# Patient Record
Sex: Male | Born: 1960
Health system: Southern US, Community
[De-identification: ages and names within clinical notes are randomized; demographics above are authoritative.]

## PROBLEM LIST (undated history)

## (undated) DIAGNOSIS — R739 Hyperglycemia, unspecified: Secondary | ICD-10-CM

## (undated) DIAGNOSIS — G4700 Insomnia, unspecified: Secondary | ICD-10-CM

## (undated) DIAGNOSIS — E785 Hyperlipidemia, unspecified: Secondary | ICD-10-CM

## (undated) HISTORY — DX: Hyperlipidemia, unspecified: E78.5

## (undated) HISTORY — DX: Hyperglycemia, unspecified: R73.9

## (undated) HISTORY — DX: Insomnia, unspecified: G47.00

---

## 1991-07-19 HISTORY — PX: HERNIA REPAIR: SHX51

## 2011-02-17 ENCOUNTER — Other Ambulatory Visit: Payer: Self-pay | Admitting: Internal Medicine

## 2011-02-17 ENCOUNTER — Inpatient Hospital Stay: Payer: Self-pay | Admitting: Student

## 2012-07-18 IMAGING — CT CT HEAD WITHOUT CONTRAST
2 series · 16 of 30 positions shown, 20 images · non-contrast
Comparison: none

REASON FOR EXAM: ams
COMMENTS:

PROCEDURE:     CT  - CT HEAD WITHOUT CONTRAST  - February 17, 2011  [DATE]
RESULT:     Technique: Helical 5mm sections were obtained from the skull
base to the vertex without administration of intravenous contrast.

[Series 2: without · axial · non-contrast · 0.45mm/px · z∈[-100,+35]mm · 13 of 33 slices shown, 17 images]
[im 3/33  brain]
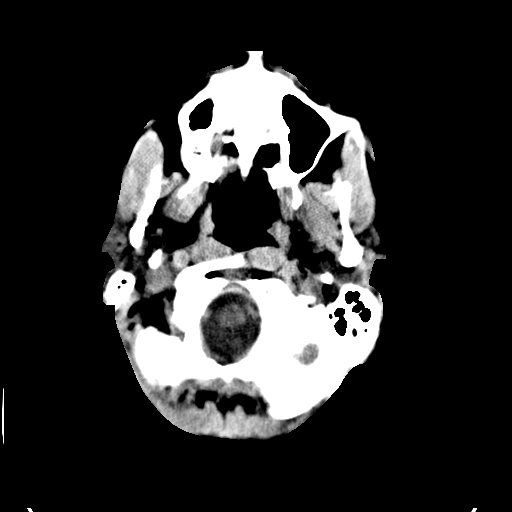
[im 3/33  bone]
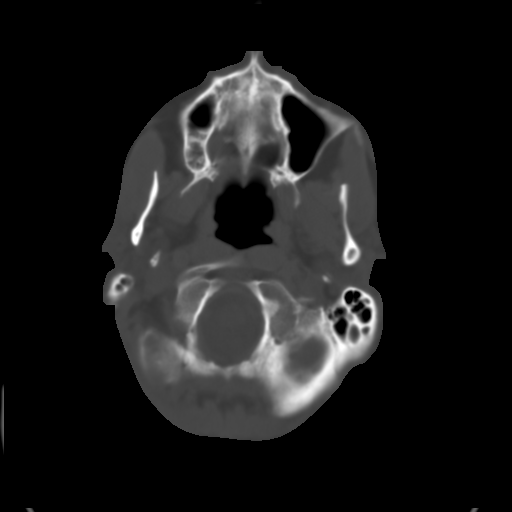
[im 5/33  brain]
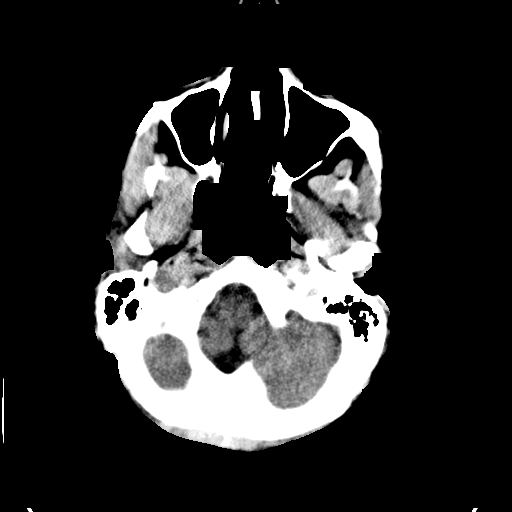
[im 7/33  brain]
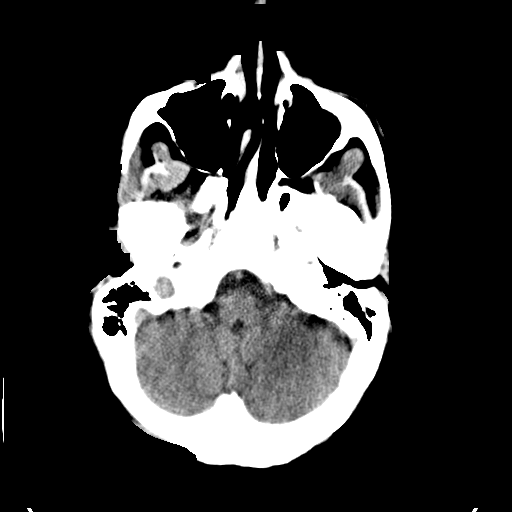
[im 10/33  brain]
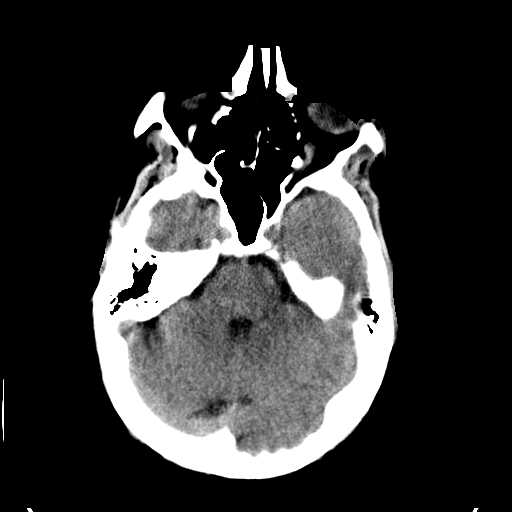
[im 12/33  brain]
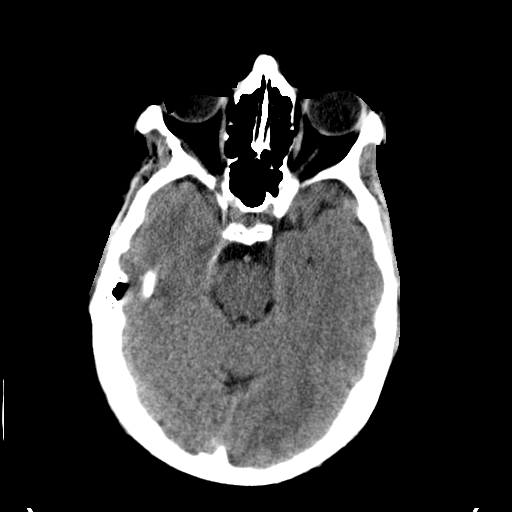
[im 12/33  bone]
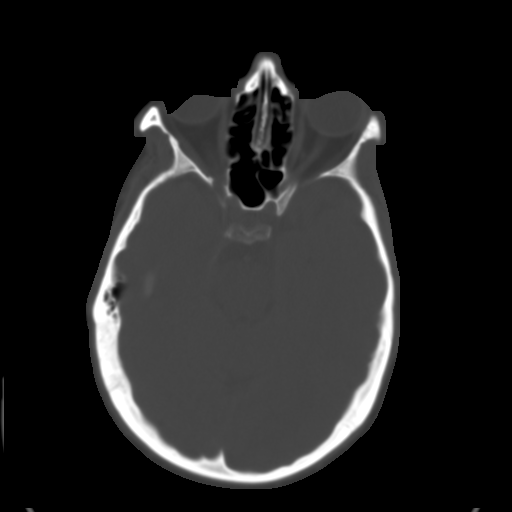
[im 14/33  brain]
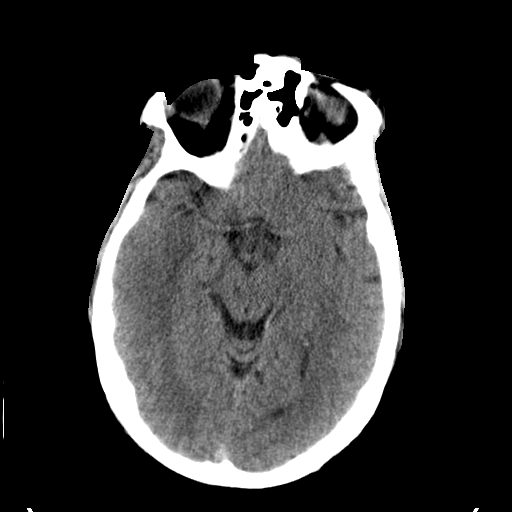
[im 17/33  brain]
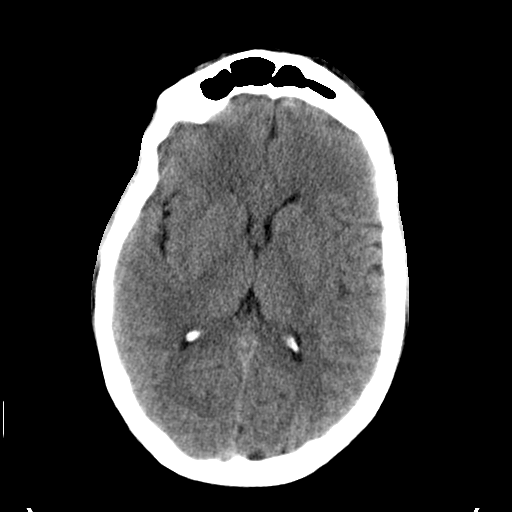
[im 19/33  brain]
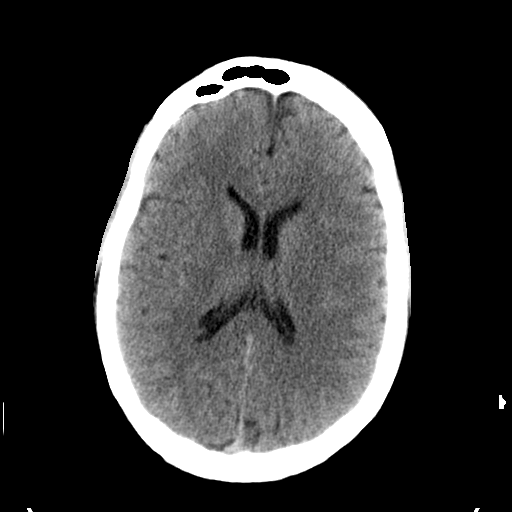
[im 21/33  brain]
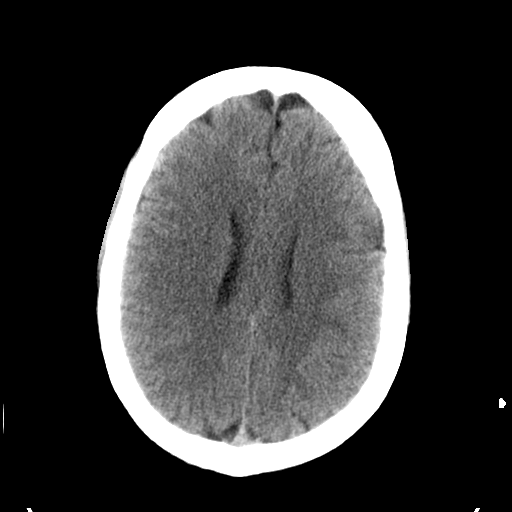
[im 21/33  bone]
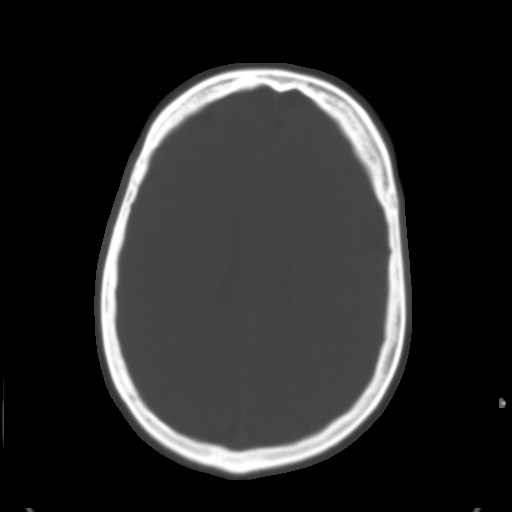
[im 23/33  brain]
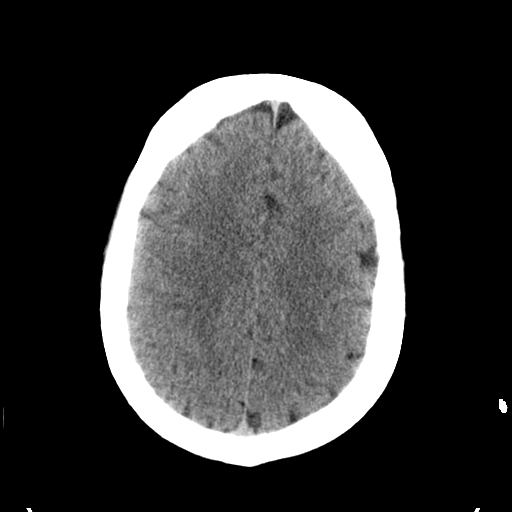
[im 26/33  brain]
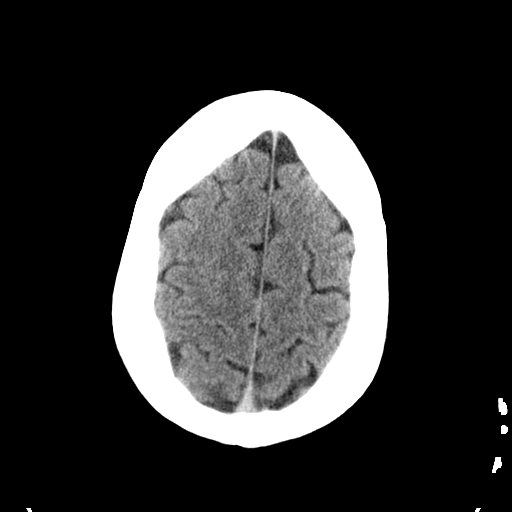
[im 28/33  brain]
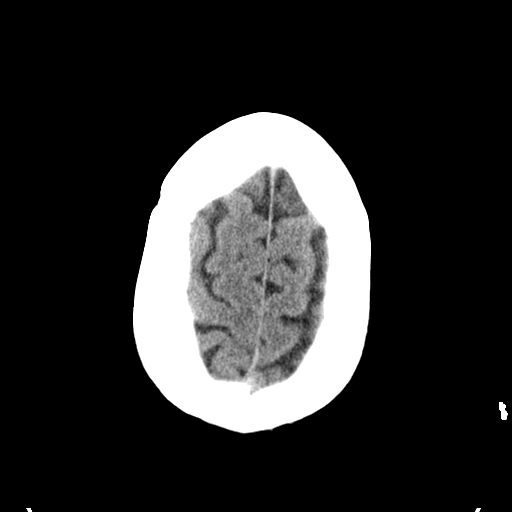
[im 30/33  brain]
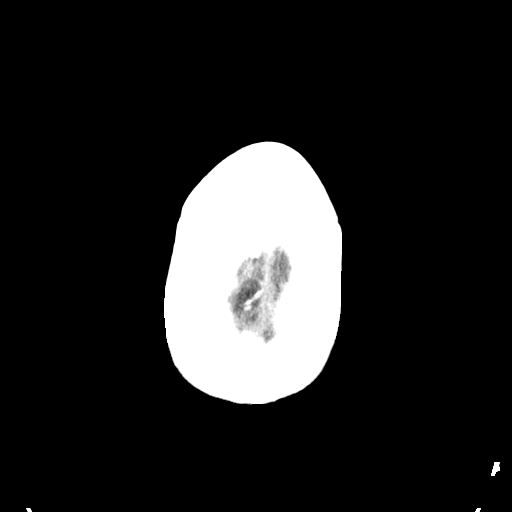
[im 30/33  bone]
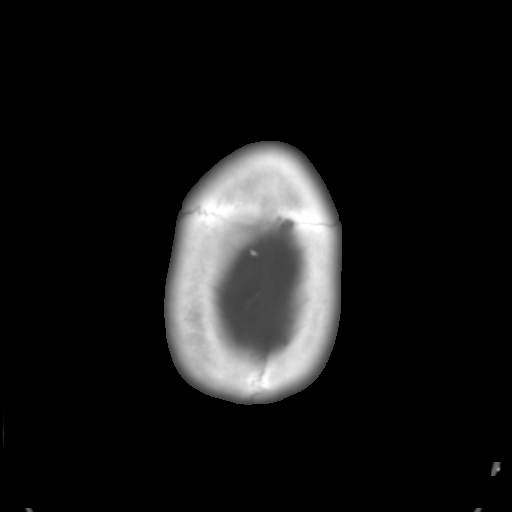

[Series 3: bone · axial · 0.45mm/px · z∈[-100,-55]mm · 3 of 33 slices shown]
[im 3/33  bone]
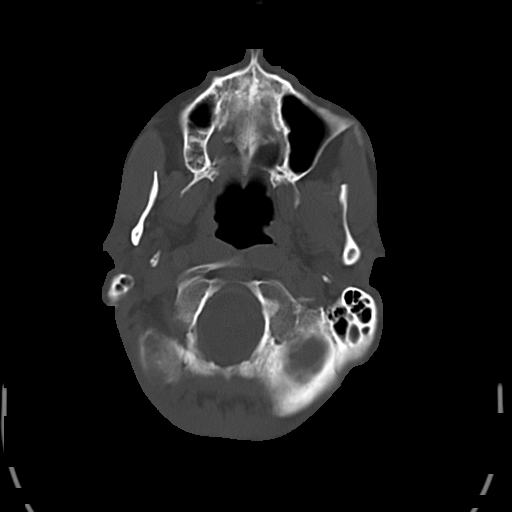
[im 7/33  bone]
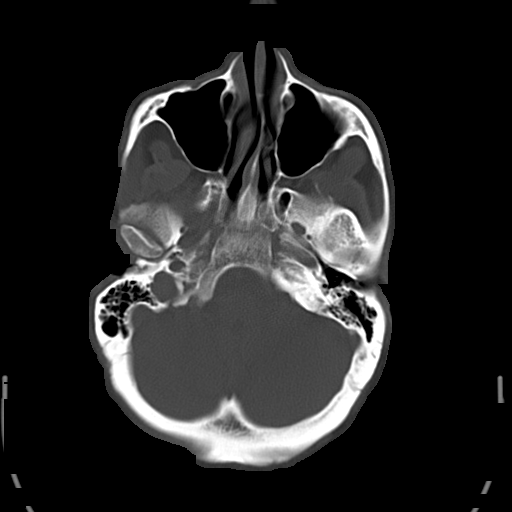
[im 12/33  bone]
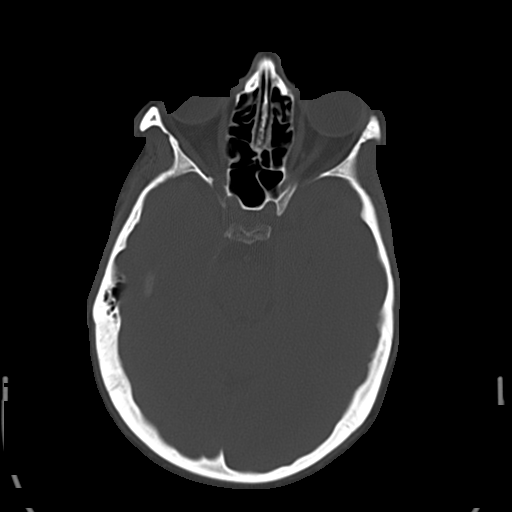

[16 of 30 positions shown; findings below may reference images not displayed]

FINDINGS: There is not evidence of intra-axial fluid collections. There is
no evidence of acute hemorrhage or secondary signs reflecting mass effect or
subacute or chronic focal territorial infarction. The osseous structures
demonstrate no evidence of a depressed skull fracture. If there is
persistent concern clinical follow-up with MRI is recommended.

There are findings which may represent a periorbital hematoma on the left.
IMPRESSION: 1. No evidence of acute intracranial abnormalitites.

## 2014-06-10 LAB — LIPID PANEL
Cholesterol: 219 mg/dL — AB (ref 0–200)
HDL: 75 mg/dL — AB (ref 35–70)
LDL CALC: 129 mg/dL
TRIGLYCERIDES: 75 mg/dL (ref 40–160)

## 2014-06-10 LAB — HEMOGLOBIN A1C: Hemoglobin A1C: 5.6

## 2014-06-10 LAB — HEPATIC FUNCTION PANEL: ALT: 22 U/L (ref 10–40)

## 2015-06-26 ENCOUNTER — Other Ambulatory Visit: Payer: Self-pay | Admitting: Family Medicine

## 2015-06-29 NOTE — Telephone Encounter (Signed)
Patient is due for follow up.  May call in rx to get by until he can be seen in office.

## 2015-06-29 NOTE — Telephone Encounter (Signed)
Rx called into pharmacy. Notified patient that he is due for f/u ov.

## 2015-07-27 ENCOUNTER — Other Ambulatory Visit: Payer: Self-pay | Admitting: Family Medicine

## 2015-10-15 DIAGNOSIS — Z8249 Family history of ischemic heart disease and other diseases of the circulatory system: Secondary | ICD-10-CM | POA: Insufficient documentation

## 2015-10-15 DIAGNOSIS — R55 Syncope and collapse: Secondary | ICD-10-CM | POA: Insufficient documentation

## 2015-10-15 DIAGNOSIS — R739 Hyperglycemia, unspecified: Secondary | ICD-10-CM | POA: Insufficient documentation

## 2015-10-15 DIAGNOSIS — G4700 Insomnia, unspecified: Secondary | ICD-10-CM | POA: Insufficient documentation

## 2015-10-15 DIAGNOSIS — E785 Hyperlipidemia, unspecified: Secondary | ICD-10-CM | POA: Insufficient documentation

## 2015-10-16 ENCOUNTER — Encounter: Payer: Self-pay | Admitting: Family Medicine

## 2015-10-16 ENCOUNTER — Ambulatory Visit (INDEPENDENT_AMBULATORY_CARE_PROVIDER_SITE_OTHER): Payer: Self-pay | Admitting: Family Medicine

## 2015-10-16 VITALS — BP 102/74 | HR 76 | Temp 98.2°F | Resp 16 | Ht >= 80 in | Wt 157.0 lb

## 2015-10-16 DIAGNOSIS — Z0000 Encounter for general adult medical examination without abnormal findings: Secondary | ICD-10-CM

## 2015-10-16 DIAGNOSIS — G4700 Insomnia, unspecified: Secondary | ICD-10-CM

## 2015-10-16 DIAGNOSIS — R739 Hyperglycemia, unspecified: Secondary | ICD-10-CM

## 2015-10-16 DIAGNOSIS — E785 Hyperlipidemia, unspecified: Secondary | ICD-10-CM

## 2015-10-16 DIAGNOSIS — K429 Umbilical hernia without obstruction or gangrene: Secondary | ICD-10-CM

## 2015-10-16 MED ORDER — ZOLPIDEM TARTRATE 5 MG PO TABS: 5.0000 mg | ORAL_TABLET | Freq: Every evening | ORAL | Status: DC | PRN

## 2015-10-16 NOTE — Patient Instructions (Signed)
You are due for a colonoscopy to screen for colon polyps and tumors. Please call the office at [phone] at your earliest convenience for a referral.

## 2015-10-16 NOTE — Progress Notes (Signed)
Patient: Bryan Beard, Male    DOB: Jul 30, 1960, 55 y.o.   MRN: [id number] Visit Date: 10/16/2015  Today's Provider: Mila Merry, MD   Chief Complaint  Patient presents with  . Annual Exam  . Hyperlipidemia  . Insomnia  . Hyperglycemia   Subjective:    Annual physical exam Bryan Beard is a 55 y.o. male who presents today for health maintenance and complete physical. He feels well. He reports exercising some/bike. He reports he is sleeping poorly/ has trouble falling asleep.  -----------------------------------------------------------------  Follow-up for insomnia from 06/10/2014; no changes. Takes Ambien about 3 times a week and tolerating well.      Lipid/Cholesterol, Follow-up:   Last seen for this 06/10/2014.  Management since that visit includes; restarted Lovastatin 20 mg qd..  Last Lipid Panel:    Component Value Date/Time   CHOL 219* 06/10/2014   TRIG 75 06/10/2014   HDL 75* 06/10/2014   LDLCALC 129 06/10/2014    He reports good compliance with treatment. He is not having side effects. none  Wt Readings from Last 3 Encounters:  10/16/15 157 lb (71.215 kg)  06/10/14 156 lb (70.761 kg)    ----------------------------------------------------------------------     Review of Systems  Constitutional: Negative.   HENT: Negative.   Eyes: Negative.   Respiratory: Negative.   Cardiovascular: Negative.   Gastrointestinal: Negative.   Endocrine: Negative.   Genitourinary: Negative.   Musculoskeletal: Negative.   Skin: Negative.   Allergic/Immunologic: Negative.   Neurological: Negative.   Hematological: Negative.   Psychiatric/Behavioral: Positive for sleep disturbance.    Social History      He  reports that he has never smoked. He does not have any smokeless tobacco history on file. He reports that he drinks alcohol. He reports that he does not use illicit drugs.       Social History   Social History  . Marital Status:  Single    Spouse Name: N/A  . Number of Children: 0  . Years of Education: N/A   Occupational History  .      Works in [employer]   Social History Main Topics  . Smoking status: Never Smoker   . Smokeless tobacco: None  . Alcohol Use: 0.0 oz/week    0 Standard drinks or equivalent per week     Comment: 3-4 beers about 3 days a week  . Drug Use: No     Comment: has used Marijuana in the past 30 years  . Sexual Activity: Not Asked   Other Topics Concern  . None   Social History Narrative    Past Medical History  Diagnosis Date  . Hyperlipidemia   . Hyperglycemia   . Insomnia      Patient Active Problem List   Diagnosis Date Noted  . Dyslipidemia 10/15/2015  . Family history of early CAD 10/15/2015  . Hyperglycemia 10/15/2015  . Insomnia 10/15/2015  . Episode of syncope 10/15/2015    Past Surgical History  Procedure Laterality Date  . Hernia repair  1993    Family History        Family Status  Relation Status Death Age  . Mother Alive   . Father Deceased 65    cause of death: MI  . Brother Alive         His family history includes CAD in his other; Diabetes in his father; Heart attack in his father.    No Known Allergies  Previous Medications  ASCORBIC ACID (VITAMIN C) 500 MG CAPS    Take 1 capsule by mouth daily.   ASPIRIN 81 MG TABLET    Take 1 tablet by mouth daily.   LOVASTATIN (MEVACOR) 20 MG TABLET    TAKE 1 TABLET BY MOUTH EVERY DAY   MULTIPLE VITAMINS-MINERALS (MULTIVITAMIN ADULT PO)    Take 1 tablet by mouth daily.   ZOLPIDEM (AMBIEN) 5 MG TABLET    TAKE 1 TABLET BY MOUTH AT BEDTIME AS NEEDED FOR INSOMNIA    Patient Care Team: Malva Limes, MD as PCP - General (Family Medicine)     Objective:   Vitals: BP 102/74 mmHg  Pulse 76  Temp(Src) 98.2 F (36.8 C) (Oral)  Resp 16  Ht 6' 11.5" (2.121 m)  Wt 157 lb (71.215 kg)  BMI 15.83 kg/m2  SpO2 98%   Physical Exam   General Appearance:    Alert, cooperative, no distress,  appears stated age  Head:    Normocephalic, without obvious abnormality, atraumatic  Eyes:    PERRL, conjunctiva/corneas clear, EOM's intact, fundi    benign, both eyes       Ears:    Normal TM's and external ear canals, both ears  Nose:   Nares normal, septum midline, mucosa normal, no drainage   or sinus tenderness  Throat:   Lips, mucosa, and tongue normal; teeth and gums normal  Neck:   Supple, symmetrical, trachea midline, no adenopathy;       thyroid:  No enlargement/tenderness/nodules; no carotid   bruit or JVD  Back:     Symmetric, no curvature, ROM normal, no CVA tenderness  Lungs:     Clear to auscultation bilaterally, respirations unlabored  Chest wall:    No tenderness or deformity  Heart:    Regular rate and rhythm, S1 and S2 normal, no murmur, rub   or gallop  Abdomen:     Soft, non-tender, bowel sounds active all four quadrants,    no masses, no organomegaly. Small non-tender umbilical hernia  Genitalia:    deferred  Rectal:    deferred  Extremities:   Extremities normal, atraumatic, no cyanosis or edema  Pulses:   2+ and symmetric all extremities  Skin:   Skin color, texture, turgor normal, no rashes or lesions  Lymph nodes:   Cervical, supraclavicular, and axillary nodes normal  Neurologic:   CNII-XII intact. Normal strength, sensation and reflexes      throughout    Depression Screen PHQ 2/9 Scores 10/16/2015  PHQ - 2 Score 0  PHQ- 9 Score 2      Assessment & Plan:     Routine Health Maintenance and Physical Exam  Exercise Activities and Dietary recommendations Goals    None      Immunization History  Administered Date(s) Administered  . Influenza-Unspecified 05/19/2015  . Tdap 11/25/2009    Health Maintenance  Topic Date Due  . HIV Screening  01/26/1976  . COLONOSCOPY  01/26/2011  . INFLUENZA VACCINE  02/16/2015  . TETANUS/TDAP  11/26/2019  . Hepatitis C Screening  Addressed      Discussed health benefits of physical activity, and  encouraged him to engage in regular exercise appropriate for his age and condition.    --------------------------------------------------------------------  1. Annual physical exam  - Comprehensive metabolic panel - PSA  2. Umbilical hernia without obstruction and without gangrene Small causing no symptoms  3. Hyperglycemia  - Hemoglobin A1c  4. Dyslipidemia He is tolerating lovastatin well with no adverse effects.   -  Lipid panel  5. Insomnia Refill ambien - zolpidem (AMBIEN) 5 MG tablet; Take 1 tablet (5 mg total) by mouth at bedtime as needed for sleep.  Dispense: 30 tablet; Refill: 3

## 2015-10-17 LAB — COMPREHENSIVE METABOLIC PANEL
A/G RATIO: 2.5 — AB (ref 1.2–2.2)
ALK PHOS: 72 IU/L (ref 39–117)
ALT: 30 IU/L (ref 0–44)
AST: 25 IU/L (ref 0–40)
Albumin: 5.2 g/dL (ref 3.5–5.5)
BUN/Creatinine Ratio: 9 (ref 9–20)
BUN: 8 mg/dL (ref 6–24)
Bilirubin Total: 0.5 mg/dL (ref 0.0–1.2)
CHLORIDE: 99 mmol/L (ref 96–106)
CO2: 23 mmol/L (ref 18–29)
Calcium: 10.5 mg/dL — ABNORMAL HIGH (ref 8.7–10.2)
Creatinine, Ser: 0.87 mg/dL (ref 0.76–1.27)
GFR calc Af Amer: 113 mL/min/{1.73_m2} (ref >59)
GFR, EST NON AFRICAN AMERICAN: 98 mL/min/{1.73_m2} (ref >59)
Globulin, Total: 2.1 g/dL (ref 1.5–4.5)
Glucose: 108 mg/dL — ABNORMAL HIGH (ref 65–99)
POTASSIUM: 4.1 mmol/L (ref 3.5–5.2)
SODIUM: 142 mmol/L (ref 134–144)
Total Protein: 7.3 g/dL (ref 6.0–8.5)

## 2015-10-17 LAB — PSA: PROSTATE SPECIFIC AG, SERUM: 0.4 ng/mL (ref 0.0–4.0)

## 2015-10-17 LAB — LIPID PANEL
Chol/HDL Ratio: 2.5 ratio units (ref 0.0–5.0)
Cholesterol, Total: 190 mg/dL (ref 100–199)
HDL: 76 mg/dL (ref >39)
LDL Calculated: 93 mg/dL (ref 0–99)
TRIGLYCERIDES: 107 mg/dL (ref 0–149)
VLDL Cholesterol Cal: 21 mg/dL (ref 5–40)

## 2015-10-17 LAB — HEMOGLOBIN A1C
ESTIMATED AVERAGE GLUCOSE: 117 mg/dL
HEMOGLOBIN A1C: 5.7 % — AB (ref 4.8–5.6)

## 2015-10-21 ENCOUNTER — Telehealth: Payer: Self-pay

## 2015-10-21 NOTE — Telephone Encounter (Signed)
Patient advised as directed below. Patient verbalized understanding.  

## 2015-10-21 NOTE — Telephone Encounter (Signed)
-----   Message from Malva Limesonald E Fisher, MD sent at 10/19/2015  1:36 PM EDT ----- PSA, kidney functions, electrolytes and cholesterol are all normal. Blood sugar slightly elevated. Avoid sweets and starchy food. Check labs yearly.

## 2015-10-21 NOTE — Telephone Encounter (Signed)
Attempted to contact patient. No answer or voicemail.  

## 2015-10-27 ENCOUNTER — Encounter: Payer: Self-pay | Admitting: Family Medicine

## 2015-12-17 ENCOUNTER — Other Ambulatory Visit: Payer: Self-pay | Admitting: Family Medicine

## 2016-04-28 ENCOUNTER — Other Ambulatory Visit: Payer: Self-pay | Admitting: Family Medicine

## 2016-04-28 DIAGNOSIS — G4700 Insomnia, unspecified: Secondary | ICD-10-CM

## 2016-04-29 NOTE — Telephone Encounter (Signed)
Please call in zolpidem  

## 2016-04-29 NOTE — Telephone Encounter (Signed)
Rx called in to pharmacy. 

## 2017-01-07 ENCOUNTER — Other Ambulatory Visit: Payer: Self-pay | Admitting: Family Medicine

## 2017-02-18 ENCOUNTER — Other Ambulatory Visit: Payer: Self-pay | Admitting: Family Medicine

## 2017-03-19 ENCOUNTER — Other Ambulatory Visit: Payer: Self-pay | Admitting: Family Medicine

## 2017-04-13 ENCOUNTER — Ambulatory Visit (INDEPENDENT_AMBULATORY_CARE_PROVIDER_SITE_OTHER): Payer: 59 | Admitting: Family Medicine

## 2017-04-13 ENCOUNTER — Encounter: Payer: Self-pay | Admitting: Family Medicine

## 2017-04-13 VITALS — BP 120/84 | Temp 98.2°F | Resp 16 | Ht 71.5 in | Wt 155.0 lb

## 2017-04-13 DIAGNOSIS — Z1211 Encounter for screening for malignant neoplasm of colon: Secondary | ICD-10-CM | POA: Diagnosis not present

## 2017-04-13 DIAGNOSIS — Z125 Encounter for screening for malignant neoplasm of prostate: Secondary | ICD-10-CM | POA: Diagnosis not present

## 2017-04-13 DIAGNOSIS — E785 Hyperlipidemia, unspecified: Secondary | ICD-10-CM | POA: Diagnosis not present

## 2017-04-13 DIAGNOSIS — G4700 Insomnia, unspecified: Secondary | ICD-10-CM | POA: Diagnosis not present

## 2017-04-13 DIAGNOSIS — R739 Hyperglycemia, unspecified: Secondary | ICD-10-CM | POA: Diagnosis not present

## 2017-04-13 NOTE — Progress Notes (Signed)
Patient: Bryan Beard Male    DOB: June 19, 1961   56 y.o.   MRN: [id number] Visit Date: 04/13/2017  Today's Provider: Lelon Huh, MD   Chief Complaint  Patient presents with  . Follow-up  . Hyperlipidemia  . Insomnia   Subjective:    HPI  Hyperglycemia From 10/16/2015-Hemoglobin A1c was 5.7  Dyslipidemia From 10/16/2015-labs checked, no changes.  Insomnia From 10/16/2015-no changes. Refilled Ambien. States he usually takes 1/2 tablet 2-3 times  Week.    No Known Allergies   Current Outpatient Prescriptions:  .  Ascorbic Acid (VITAMIN C) 500 MG CAPS, Take 1 capsule by mouth daily., Disp: , Rfl:  .  aspirin 81 MG tablet, Take 1 tablet by mouth daily., Disp: , Rfl:  .  Cholecalciferol (VITAMIN D PO), Take by mouth., Disp: , Rfl:  .  lovastatin (MEVACOR) 20 MG tablet, TAKE 1 TABLET BY MOUTH EVERY DAY. APPT NEEDED FOR FURTHER REFILLS, Disp: 30 tablet, Rfl: 0 .  Multiple Vitamins-Minerals (MULTIVITAMIN ADULT PO), Take 1 tablet by mouth daily., Disp: , Rfl:  .  zolpidem (AMBIEN) 5 MG tablet, TAKE 1 TABLET BY MOUTH AT BEDTIME AS NEEDED FOR SLEEP, Disp: 30 tablet, Rfl: 5  Review of Systems  Constitutional: Negative for appetite change, chills and fever.  Respiratory: Negative for chest tightness, shortness of breath and wheezing.   Cardiovascular: Negative for chest pain and palpitations.  Gastrointestinal: Negative for abdominal pain, nausea and vomiting.    Social History  Substance Use Topics  . Smoking status: Never Smoker  . Smokeless tobacco: Never Used  . Alcohol use 0.0 oz/week     Comment: 3-4 beers about 3 days a week   Objective:   BP 120/84 (BP Location: Right Arm, Patient Position: Sitting, Cuff Size: Normal)   Temp 98.2 F (36.8 C) (Oral)   Resp 16   Ht 5' 11.5" (1.816 m)   Wt 155 lb (70.3 kg)   BMI 21.32 kg/m    Depression screen [facility] 2/9 04/13/2017 10/16/2015  Decreased Interest 1 0  Down, Depressed, Hopeless 1 0  PHQ - 2 Score 2 0    Altered sleeping 2 2  Tired, decreased energy 1 0  Change in appetite 0 0  Feeling bad or failure about yourself  0 0  Trouble concentrating 0 0  Moving slowly or fidgety/restless 0 0  Suicidal thoughts 0 0  PHQ-9 Score 5 2  Difficult doing work/chores Somewhat difficult Somewhat difficult    Physical Exam   General Appearance:    Alert, cooperative, no distress  Eyes:    PERRL, conjunctiva/corneas clear, EOM's intact       Lungs:     Clear to auscultation bilaterally, respirations unlabored  Heart:    Regular rate and rhythm  Neurologic:   Awake, alert, oriented x 3. No apparent focal neurological           defect.           Assessment & Plan:     1. Dyslipidemia He is tolerating lovastatin well with no adverse effects.   - Lipid panel - COMPLETE METABOLIC PANEL WITH GFR  2. Hyperglycemia  - COMPLETE METABOLIC PANEL WITH GFR - Hemoglobin A1c  3. Colon cancer screening Sent with iFOBT collection kit  4. Prostate cancer screening  - PSA  5. Insomnia, unspecified type Doing well with prn zolpidem.        Lelon Huh, MD  [facility]

## 2017-04-13 NOTE — Patient Instructions (Signed)
It's very important to screen for early colon cancer. Please collect a stool sample with the kit provider and return it to our office at your earliest convenience

## 2017-04-14 LAB — COMPLETE METABOLIC PANEL WITH GFR
AG Ratio: 1.8 (calc) (ref 1.0–2.5)
ALBUMIN MSPROF: 4.6 g/dL (ref 3.6–5.1)
ALT: 17 U/L (ref 9–46)
AST: 20 U/L (ref 10–35)
Alkaline phosphatase (APISO): 66 U/L (ref 40–115)
BILIRUBIN TOTAL: 0.6 mg/dL (ref 0.2–1.2)
BUN: 12 mg/dL (ref 7–25)
CALCIUM: 10.3 mg/dL (ref 8.6–10.3)
CO2: 27 mmol/L (ref 20–32)
CREATININE: 0.87 mg/dL (ref 0.70–1.33)
Chloride: 102 mmol/L (ref 98–110)
GFR, EST AFRICAN AMERICAN: 112 mL/min/{1.73_m2} (ref >=60)
GFR, EST NON AFRICAN AMERICAN: 96 mL/min/{1.73_m2} (ref >=60)
GLUCOSE: 101 mg/dL — AB (ref 65–99)
Globulin: 2.5 g/dL (calc) (ref 1.9–3.7)
Potassium: 4.2 mmol/L (ref 3.5–5.3)
Sodium: 140 mmol/L (ref 135–146)
TOTAL PROTEIN: 7.1 g/dL (ref 6.1–8.1)

## 2017-04-14 LAB — LIPID PANEL
CHOLESTEROL: 194 mg/dL (ref <200)
HDL: 65 mg/dL (ref >40)
LDL CHOLESTEROL (CALC): 104 mg/dL — AB
Non-HDL Cholesterol (Calc): 129 mg/dL (calc) (ref <130)
Total CHOL/HDL Ratio: 3 (calc) (ref <5.0)
Triglycerides: 145 mg/dL (ref <150)

## 2017-04-14 LAB — HEMOGLOBIN A1C
HEMOGLOBIN A1C: 5.4 %{Hb} (ref <5.7)
MEAN PLASMA GLUCOSE: 108 (calc)
eAG (mmol/L): 6 (calc)

## 2017-04-14 LAB — PSA: PSA: 0.3 ng/mL (ref <=4.0)

## 2017-04-25 ENCOUNTER — Other Ambulatory Visit: Payer: Self-pay | Admitting: Family Medicine

## 2017-05-14 ENCOUNTER — Other Ambulatory Visit: Payer: Self-pay | Admitting: Family Medicine

## 2017-05-14 DIAGNOSIS — G4700 Insomnia, unspecified: Secondary | ICD-10-CM

## 2017-05-14 NOTE — Telephone Encounter (Signed)
Please call in zolpidem  

## 2017-05-15 NOTE — Telephone Encounter (Signed)
Done. Prescription called into the pharmacy.  

## 2018-05-18 ENCOUNTER — Ambulatory Visit (INDEPENDENT_AMBULATORY_CARE_PROVIDER_SITE_OTHER): Payer: BLUE CROSS/BLUE SHIELD | Admitting: Family Medicine

## 2018-05-18 ENCOUNTER — Encounter: Payer: Self-pay | Admitting: Family Medicine

## 2018-05-18 VITALS — BP 118/74 | HR 74 | Temp 98.1°F | Wt 152.2 lb

## 2018-05-18 DIAGNOSIS — E785 Hyperlipidemia, unspecified: Secondary | ICD-10-CM

## 2018-05-18 DIAGNOSIS — Z125 Encounter for screening for malignant neoplasm of prostate: Secondary | ICD-10-CM

## 2018-05-18 DIAGNOSIS — Z23 Encounter for immunization: Secondary | ICD-10-CM

## 2018-05-18 DIAGNOSIS — G4700 Insomnia, unspecified: Secondary | ICD-10-CM | POA: Diagnosis not present

## 2018-05-18 DIAGNOSIS — Z1211 Encounter for screening for malignant neoplasm of colon: Secondary | ICD-10-CM

## 2018-05-18 DIAGNOSIS — R739 Hyperglycemia, unspecified: Secondary | ICD-10-CM | POA: Diagnosis not present

## 2018-05-18 NOTE — Patient Instructions (Signed)
   The CDC recommends two doses of Shingrix (the shingles vaccine) separated by 2 to 6 months for adults age 57 years and older. I recommend checking with your insurance plan regarding coverage for this vaccine.   

## 2018-05-18 NOTE — Progress Notes (Signed)
Patient: Bryan Beard Male    DOB: [birth year]   57 y.o.   MRN: [id number] Visit Date: 05/18/2018  Today's Provider: Mila Merry, MD   Chief Complaint  Patient presents with  . Hyperlipidemia  . Insomnia   Subjective:    HPI  Lipid/Cholesterol, Follow-up:   Last seen for this 1 years ago.  Management changes since that visit include none. . Last Lipid Panel:    Component Value Date/Time   CHOL 194 04/13/2017 0914   CHOL 190 10/16/2015 0943   TRIG 145 04/13/2017 0914   HDL 65 04/13/2017 0914   HDL 76 10/16/2015 0943   CHOLHDL 3.0 04/13/2017 0914   LDLCALC 104 (H) 04/13/2017 0914    Risk factors for vascular disease include   He reports good compliance with treatment. He is not having side effects.  Current symptoms include none and have been stable. Weight trend: stable Prior visit with dietician: no Current diet: well balanced Current exercise: bicycling and walking.  Wt Readings from Last 3 Encounters:  05/18/18 152 lb 3.2 oz (69 kg)  04/13/17 155 lb (70.3 kg)  10/16/15 157 lb (71.2 kg)    -------------------------------------------------------------------   Insomnia Patient last office visit was 04/13/2017. Patient states that his insomnia is improving for the help of the Ambien. Takes about 4 times a week and continues to work very well with no adverse effects.   No Known Allergies   Current Outpatient Medications:  .  Ascorbic Acid (VITAMIN C) 500 MG CAPS, Take 1 capsule by mouth daily., Disp: , Rfl:  .  aspirin 81 MG tablet, Take 1 tablet by mouth daily., Disp: , Rfl:  .  Cholecalciferol (VITAMIN D PO), Take by mouth., Disp: , Rfl:  .  lovastatin (MEVACOR) 20 MG tablet, Take 1 tablet every evening, Disp: 30 tablet, Rfl: 12 .  Multiple Vitamins-Minerals (MULTIVITAMIN ADULT PO), Take 1 tablet by mouth daily., Disp: , Rfl:  .  zolpidem (AMBIEN) 5 MG tablet, TAKE 1 TABLET BY MOUTH EVERY DAY AT BEDTIME AS NEEDED FOR SLEEP, Disp: 30 tablet,  Rfl: 5  Review of Systems  Constitutional: Positive for fatigue.  HENT: Negative.   Respiratory: Negative.   Cardiovascular: Negative.   Gastrointestinal: Negative.   Genitourinary: Negative.   Allergic/Immunologic: Negative.   Neurological: Negative.     Social History   Tobacco Use  . Smoking status: Never Smoker  . Smokeless tobacco: Never Used  Substance Use Topics  . Alcohol use: Yes    Alcohol/week: 0.0 standard drinks    Comment: 3-4 beers about 3 days a week   Objective:   BP 118/74 (BP Location: Right Arm, Patient Position: Sitting, Cuff Size: Normal)   Pulse 74   Temp 98.1 F (36.7 C) (Oral)   Wt 152 lb 3.2 oz (69 kg)   SpO2 99%   BMI 20.93 kg/m  Vitals:   05/18/18 1521  BP: 118/74  Pulse: 74  Temp: 98.1 F (36.7 C)  TempSrc: Oral  SpO2: 99%  Weight: 152 lb 3.2 oz (69 kg)     Physical Exam   General Appearance:    Alert, cooperative, no distress  Eyes:    PERRL, conjunctiva/corneas clear, EOM's intact       Lungs:     Clear to auscultation bilaterally, respirations unlabored  Heart:    Regular rate and rhythm         Assessment & Plan:     1. Dyslipidemia He is tolerating  lovastatin well with no adverse effects.   - Comprehensive metabolic panel - Lipid panel  2. Hyperglycemia  - Hemoglobin A1c  3. Insomnia, unspecified type Doing well on zolpidem.   4. Prostate cancer screening  - PSA  5. Colon cancer screening  - Cologuard  6. Need for influenza vaccination  - Flu Vaccine QUAD 36+ mos IM       Mila Merry, MD  [facility]

## 2018-05-19 LAB — COMPREHENSIVE METABOLIC PANEL
ALBUMIN: 5 g/dL (ref 3.5–5.5)
ALT: 17 IU/L (ref 0–44)
AST: 23 IU/L (ref 0–40)
Albumin/Globulin Ratio: 2.2 (ref 1.2–2.2)
Alkaline Phosphatase: 69 IU/L (ref 39–117)
BUN / CREAT RATIO: 9 (ref 9–20)
BUN: 8 mg/dL (ref 6–24)
Bilirubin Total: 0.5 mg/dL (ref 0.0–1.2)
CALCIUM: 10.3 mg/dL — AB (ref 8.7–10.2)
CO2: 24 mmol/L (ref 20–29)
CREATININE: 0.91 mg/dL (ref 0.76–1.27)
Chloride: 98 mmol/L (ref 96–106)
GFR calc Af Amer: 108 mL/min/{1.73_m2} (ref >59)
GFR calc non Af Amer: 93 mL/min/{1.73_m2} (ref >59)
GLOBULIN, TOTAL: 2.3 g/dL (ref 1.5–4.5)
GLUCOSE: 89 mg/dL (ref 65–99)
Potassium: 4 mmol/L (ref 3.5–5.2)
SODIUM: 141 mmol/L (ref 134–144)
TOTAL PROTEIN: 7.3 g/dL (ref 6.0–8.5)

## 2018-05-19 LAB — HEMOGLOBIN A1C
Est. average glucose Bld gHb Est-mCnc: 111 mg/dL
Hgb A1c MFr Bld: 5.5 % (ref 4.8–5.6)

## 2018-05-19 LAB — PSA: PROSTATE SPECIFIC AG, SERUM: 0.4 ng/mL (ref 0.0–4.0)

## 2018-05-19 LAB — LIPID PANEL
CHOL/HDL RATIO: 2.7 ratio (ref 0.0–5.0)
CHOLESTEROL TOTAL: 191 mg/dL (ref 100–199)
HDL: 70 mg/dL (ref >39)
LDL CALC: 106 mg/dL — AB (ref 0–99)
Triglycerides: 77 mg/dL (ref 0–149)
VLDL CHOLESTEROL CAL: 15 mg/dL (ref 5–40)

## 2018-05-21 ENCOUNTER — Telehealth: Payer: Self-pay

## 2018-05-21 NOTE — Telephone Encounter (Signed)
Pt advised.   Thanks,   -Laura  

## 2018-05-21 NOTE — Telephone Encounter (Signed)
No answer will call patient back later.,PC

## 2018-05-21 NOTE — Telephone Encounter (Signed)
-----   Message from Malva Limes, MD sent at 05/20/2018  8:33 PM EST ----- Cholesterol is well controlled at 191. Normal psa and blood sugar. Continue current medications.  Check yearly.

## 2018-05-23 ENCOUNTER — Other Ambulatory Visit: Payer: Self-pay | Admitting: Family Medicine

## 2018-05-23 DIAGNOSIS — G4700 Insomnia, unspecified: Secondary | ICD-10-CM

## 2018-05-23 MED ORDER — LOVASTATIN 20 MG PO TABS: ORAL_TABLET | ORAL | 12 refills | Status: DC

## 2018-05-23 MED ORDER — ZOLPIDEM TARTRATE 5 MG PO TABS: ORAL_TABLET | ORAL | 5 refills | Status: DC

## 2018-05-23 NOTE — Telephone Encounter (Signed)
Pt needing refill on:  zolpidem (AMBIEN) 5 MG tablet lovastatin (MEVACOR) 20 MG tablet  Please fill at:  [id number] - [place], [place] - [address] [phone] (Phone) [phone] (Fax)    Thanks, [employer]

## 2019-04-26 ENCOUNTER — Other Ambulatory Visit: Payer: Self-pay

## 2019-04-26 DIAGNOSIS — G4700 Insomnia, unspecified: Secondary | ICD-10-CM

## 2019-04-26 NOTE — Telephone Encounter (Signed)
Patient requesting refills on medication. [facility] [place].

## 2019-04-27 MED ORDER — ZOLPIDEM TARTRATE 5 MG PO TABS: ORAL_TABLET | ORAL | 0 refills | Status: DC

## 2019-07-09 ENCOUNTER — Other Ambulatory Visit: Payer: Self-pay | Admitting: Family Medicine

## 2020-01-08 ENCOUNTER — Telehealth: Payer: Self-pay

## 2020-01-08 NOTE — Telephone Encounter (Signed)
Copied from CRM [id number]. Topic: General - Call Back - No Documentation >> Jan 08, 2020  3:59 PM Bryan Beard wrote: Reason for CRM: Patient wanted to know if he could come in about an hour early for lab work on his scheduled appointment on 6/28. I did advise patient he can arrive 15 minutes early, and someone will notify him if he is able to come in any earlier.

## 2020-01-10 NOTE — Progress Notes (Signed)
Established patient visit   Patient: Bryan Beard   DOB: May 11, 1961   59 y.o. Male  MRN: [id number] Visit Date: 01/13/2020  Today's healthcare provider: Lelon Huh, MD   Chief Complaint  Patient presents with  . Hyperlipidemia  . Insomnia  . Hyperglycemia   Mertie Moores as a scribe for Lelon Huh, MD.,have documented all relevant documentation on the behalf of Nathanel Tallman, MD,as directed by  Lelon Huh, MD while in the presence of Lelon Huh, MD.  Subjective    HPI Lipid/Cholesterol, Follow-up  Last lipid panel Other pertinent labs  Lab Results  Component Value Date   CHOL 191 05/18/2018   HDL 70 05/18/2018   LDLCALC 106 (H) 05/18/2018   TRIG 77 05/18/2018   CHOLHDL 2.7 05/18/2018   Lab Results  Component Value Date   ALT 17 05/18/2018   AST 23 05/18/2018     He was last seen for this 05/18/2018 Management since that visit includes no change.  He reports good compliance with treatment. He is not having side effects.   Symptoms: No chest pain No chest pressure/discomfort  No dyspnea No lower extremity edema  No numbness or tingling of extremity No orthopnea  No palpitations No paroxysmal nocturnal dyspnea  No speech difficulty No syncope   Current diet: in general, a "healthy" diet   Current exercise: bicycling  The 10-year ASCVD risk score Mikey Bussing DC Jr., et al., 2013) is: 4.4%  --------------------------------------------------------------------------------------------------- Follow up for Insomnia:   The patient was last seen for this 05/18/2018  Changes made at last visit include no change.  He reports good compliance with treatment. He feels that condition is Unchanged. He is not having side effects.   -----------------------------------------------------------------------------------------  Follow up for Hyperglycemia:   The patient was last seen for this 05/18/2018 Changes made at last visit include no  changes.  He reports good compliance with treatment. He feels that condition is stable. He is not having side effects.   -----------------------------------------------------------------------------------------     Medications: Outpatient Medications Prior to Visit  Medication Sig  . Ascorbic Acid (VITAMIN C) 500 MG CAPS Take 1 capsule by mouth daily.  Marland Kitchen aspirin 81 MG tablet Take 1 tablet by mouth daily.  . Cholecalciferol (VITAMIN D PO) Take by mouth.  . lovastatin (MEVACOR) 20 MG tablet TAKE 1 TABLET BY MOUTH EVERY DAY IN THE EVENING  . Multiple Vitamins-Minerals (MULTIVITAMIN ADULT PO) Take 1 tablet by mouth daily.  Marland Kitchen zolpidem (AMBIEN) 5 MG tablet TAKE 1 TABLET BY MOUTH EVERY DAY AT BEDTIME AS NEEDED FOR SLEEP   No facility-administered medications prior to visit.    Review of Systems  Constitutional: Negative.   Respiratory: Negative.   Cardiovascular: Negative.   Musculoskeletal: Negative.      Objective    BP 114/75 (BP Location: Right Arm, Patient Position: Sitting, Cuff Size: Normal)   Pulse 67   Temp (!) 96.9 F (36.1 C) (Temporal)   Ht 5' 11.5" (1.816 m)   Wt 159 lb 12.8 oz (72.5 kg)   BMI 21.98 kg/m   Physical Exam   General: Appearance:    Well developed, well nourished male in no acute distress  Eyes:    PERRL, conjunctiva/corneas clear, EOM's intact       Lungs:     Clear to auscultation bilaterally, respirations unlabored  Heart:    Normal heart rate. Normal rhythm. No murmurs, rubs, or gallops.   MS:   All extremities are intact.  Neurologic:   Awake, alert, oriented x 3. No apparent focal neurological           defect.        No results found for any visits on 01/13/20.  Assessment & Plan     1. Dyslipidemia He is tolerating lovastatin well with no adverse effects.    2. Insomnia, unspecified type Doing well with zolpidem which he only takes 1/2 tablet a few days each week.   3. Hyperglycemia Checking met C today.   4. Colon cancer  screening Counseled on importance of colon cancer he screen. He did not follow through with Cologuard in the past, but states he will do one now.  - Cologuard   No follow-ups on file.      The entirety of the information documented in the History of Present Illness, Review of Systems and Physical Exam were personally obtained by me. Portions of this information were initially documented by the CMA and reviewed by me for thoroughness and accuracy.      Lelon Huh, MD  [facility] [phone] (phone) [phone] (fax)  [place]

## 2020-01-13 ENCOUNTER — Other Ambulatory Visit: Payer: Self-pay

## 2020-01-13 ENCOUNTER — Encounter: Payer: Self-pay | Admitting: Family Medicine

## 2020-01-13 ENCOUNTER — Ambulatory Visit (INDEPENDENT_AMBULATORY_CARE_PROVIDER_SITE_OTHER): Payer: BLUE CROSS/BLUE SHIELD | Admitting: Family Medicine

## 2020-01-13 VITALS — BP 114/75 | HR 67 | Temp 96.9°F | Ht 71.5 in | Wt 159.8 lb

## 2020-01-13 DIAGNOSIS — G4700 Insomnia, unspecified: Secondary | ICD-10-CM

## 2020-01-13 DIAGNOSIS — R739 Hyperglycemia, unspecified: Secondary | ICD-10-CM | POA: Diagnosis not present

## 2020-01-13 DIAGNOSIS — E785 Hyperlipidemia, unspecified: Secondary | ICD-10-CM | POA: Diagnosis not present

## 2020-01-13 DIAGNOSIS — Z1211 Encounter for screening for malignant neoplasm of colon: Secondary | ICD-10-CM | POA: Diagnosis not present

## 2020-01-14 ENCOUNTER — Other Ambulatory Visit: Payer: Self-pay | Admitting: Family Medicine

## 2020-01-14 ENCOUNTER — Telehealth: Payer: Self-pay

## 2020-01-14 LAB — COMPREHENSIVE METABOLIC PANEL
ALT: 22 IU/L (ref 0–44)
AST: 24 IU/L (ref 0–40)
Albumin/Globulin Ratio: 2.5 — ABNORMAL HIGH (ref 1.2–2.2)
Albumin: 4.9 g/dL (ref 3.8–4.9)
Alkaline Phosphatase: 83 IU/L (ref 48–121)
BUN/Creatinine Ratio: 11 (ref 9–20)
BUN: 10 mg/dL (ref 6–24)
Bilirubin Total: 0.7 mg/dL (ref 0.0–1.2)
CO2: 23 mmol/L (ref 20–29)
Calcium: 9.7 mg/dL (ref 8.7–10.2)
Chloride: 103 mmol/L (ref 96–106)
Creatinine, Ser: 0.94 mg/dL (ref 0.76–1.27)
GFR calc Af Amer: 103 mL/min/{1.73_m2} (ref >59)
GFR calc non Af Amer: 89 mL/min/{1.73_m2} (ref >59)
Globulin, Total: 2 g/dL (ref 1.5–4.5)
Glucose: 95 mg/dL (ref 65–99)
Potassium: 3.7 mmol/L (ref 3.5–5.2)
Sodium: 139 mmol/L (ref 134–144)
Total Protein: 6.9 g/dL (ref 6.0–8.5)

## 2020-01-14 LAB — LIPID PANEL
Chol/HDL Ratio: 2.7 ratio (ref 0.0–5.0)
Cholesterol, Total: 183 mg/dL (ref 100–199)
HDL: 69 mg/dL (ref >39)
LDL Chol Calc (NIH): 98 mg/dL (ref 0–99)
Triglycerides: 86 mg/dL (ref 0–149)
VLDL Cholesterol Cal: 16 mg/dL (ref 5–40)

## 2020-01-14 NOTE — Telephone Encounter (Signed)
Requested medications are due for refill today?  Yes   Requested medications are on active medication list?  Yes  Last Refill:   07/09/2019  # 90 with one refill.    Future visit scheduled?  No - Patient was seen yesterday.    Notes to Clinic:  Medication failed RX refill protocol due to no labs within past 360 days.  Last labs were performed on 05/18/2018.  There are active lab orders for future date, but have not been released / drawn yet.

## 2020-01-14 NOTE — Telephone Encounter (Signed)
-----   Message from Malva Limes, MD sent at 01/14/2020 12:25 PM EDT ----- Cholesterol is very well controlled. Continue current dose of lovastatin. (patient's said is cell phone is dead, but that he will be able to answer his house phone after 3pm).

## 2020-01-14 NOTE — Telephone Encounter (Signed)
Attempted to contact patient, no answer left a voicemail. Okay for PEC to advise patient.  

## 2020-01-15 NOTE — Telephone Encounter (Signed)
Attempted to contact pt. On mobile #; phone rang but no answer and no vm.  From previous message will try to contact pt. On home # after 3:00 PM.

## 2020-01-15 NOTE — Telephone Encounter (Signed)
Patient called on numbers listed, mobile # rang with no answer. Left VM on home number to call the office back ([phone]).

## 2020-01-16 ENCOUNTER — Other Ambulatory Visit: Payer: Self-pay | Admitting: Family Medicine

## 2020-01-16 DIAGNOSIS — G4700 Insomnia, unspecified: Secondary | ICD-10-CM

## 2020-01-16 NOTE — Telephone Encounter (Signed)
PT need a refill  zolpidem (AMBIEN) 5 MG tablet [[id number] [id number] [place], [place] - [address]  [address] [place] [address]  Phone: [phone] Fax: [phone]

## 2020-01-16 NOTE — Telephone Encounter (Signed)
Requested medication (s) are due for refill today: yes  Requested medication (s) are on the active medication list: yes  Last refill:  04/26/19  Future visit scheduled: Had appointment 3 days ago  Notes to clinic:  Med not delegated    Requested Prescriptions  Pending Prescriptions Disp Refills   zolpidem (AMBIEN) 5 MG tablet 30 tablet 0    Sig: TAKE 1 TABLET BY MOUTH EVERY DAY AT BEDTIME AS NEEDED FOR SLEEP      Not Delegated - Psychiatry:  Anxiolytics/Hypnotics Failed - 01/16/2020  3:46 PM      Failed - This refill cannot be delegated      Failed - Urine Drug Screen completed in last 360 days.      Passed - Valid encounter within last 6 months    Recent Outpatient Visits           3 days ago Dyslipidemia   [facility] Malva Limes, MD   1 year ago Dyslipidemia   [facility] Malva Limes, MD   2 years ago Dyslipidemia   [facility] Malva Limes, MD   4 years ago Annual physical exam   [facility] Malva Limes, MD

## 2020-01-17 MED ORDER — ZOLPIDEM TARTRATE 5 MG PO TABS: ORAL_TABLET | ORAL | 3 refills | Status: DC

## 2020-01-17 NOTE — Telephone Encounter (Signed)
Attempted to contact patient on both numbers in chart. Letter mailed to address in chart.

## 2020-09-02 ENCOUNTER — Other Ambulatory Visit: Payer: Self-pay | Admitting: Family Medicine

## 2021-01-21 ENCOUNTER — Telehealth: Payer: Self-pay

## 2021-01-21 DIAGNOSIS — R739 Hyperglycemia, unspecified: Secondary | ICD-10-CM

## 2021-01-21 DIAGNOSIS — E785 Hyperlipidemia, unspecified: Secondary | ICD-10-CM

## 2021-01-21 NOTE — Telephone Encounter (Signed)
FYI

## 2021-01-21 NOTE — Telephone Encounter (Signed)
Copied from CRM [id number]. Topic: General - Other >> Jan 21, 2021  1:59 PM Gwenlyn Fudge wrote: Reason for CRM: Pt called stating that he would like to have his A1C checked and Cholesterol before his appt tomorrow. Please advise.

## 2021-01-22 ENCOUNTER — Ambulatory Visit (INDEPENDENT_AMBULATORY_CARE_PROVIDER_SITE_OTHER): Payer: BLUE CROSS/BLUE SHIELD | Admitting: Family Medicine

## 2021-01-22 ENCOUNTER — Encounter: Payer: Self-pay | Admitting: Family Medicine

## 2021-01-22 ENCOUNTER — Other Ambulatory Visit: Payer: Self-pay

## 2021-01-22 VITALS — BP 107/62 | HR 67 | Temp 98.3°F | Resp 16 | Ht 71.5 in | Wt 152.0 lb

## 2021-01-22 DIAGNOSIS — R739 Hyperglycemia, unspecified: Secondary | ICD-10-CM

## 2021-01-22 DIAGNOSIS — E785 Hyperlipidemia, unspecified: Secondary | ICD-10-CM | POA: Diagnosis not present

## 2021-01-22 DIAGNOSIS — G4700 Insomnia, unspecified: Secondary | ICD-10-CM | POA: Diagnosis not present

## 2021-01-22 MED ORDER — ZOLPIDEM TARTRATE 5 MG PO TABS: ORAL_TABLET | ORAL | 3 refills | Status: DC

## 2021-01-22 MED ORDER — LOVASTATIN 20 MG PO TABS: 20.0000 mg | ORAL_TABLET | Freq: Every evening | ORAL | 1 refills | Status: DC

## 2021-01-22 NOTE — Progress Notes (Signed)
Established patient visit   Patient: Bryan Beard   DOB: 1960/08/07   60 y.o. Male  MRN: [id number] Visit Date: 01/22/2021  Today's healthcare provider: Dortha Kern, PA-C   Chief Complaint  Patient presents with   Hyperlipidemia   Hyperglycemia   Subjective    HPI  Lipid/Cholesterol, Follow-up  Last lipid panel Other pertinent labs  Lab Results  Component Value Date   CHOL 183 01/13/2020   HDL 69 01/13/2020   LDLCALC 98 01/13/2020   TRIG 86 01/13/2020   CHOLHDL 2.7 01/13/2020   Lab Results  Component Value Date   ALT 22 01/13/2020   AST 24 01/13/2020     He was last seen for this 1 year ago.  Management since that visit includes no medication changes.  He reports good compliance with treatment. He is not having side effects.   Symptoms: No chest pain No chest pressure/discomfort  No dyspnea No lower extremity edema  No numbness or tingling of extremity No orthopnea  No palpitations No paroxysmal nocturnal dyspnea  No speech difficulty No syncope   Current diet: well balanced Current exercise: no regular exercise  The 10-year ASCVD risk score Denman George DC Jr., et al., 2013) is: 4.2%  Prediabetes, Follow-up  Lab Results  Component Value Date   HGBA1C 5.5 05/18/2018   HGBA1C 5.4 04/13/2017   HGBA1C 5.7 (H) 10/16/2015   GLUCOSE 95 01/13/2020   GLUCOSE 89 05/18/2018   GLUCOSE 101 (H) 04/13/2017    Last seen for for this1 years ago.  Management since that visit includes no medication changes. Current symptoms include none and have been stable.  Prior visit with dietician: no Current diet: well balanced Current exercise: no regular exercise  No past medical history on file. Past Surgical History:  Procedure Laterality Date   HERNIA REPAIR  1993   Right inguinal   Social History   Tobacco Use   Smoking status: Never   Smokeless tobacco: Never  Substance Use Topics   Alcohol use: Yes    Alcohol/week: 0.0 standard drinks     Comment: 3-4 beers about 3 days a week   Drug use: No    Comment: has used Marijuana in the past 30 years   Family Status  Relation Name Status   Mother  Deceased       died at age 27.    Father  Deceased at age 23       cause of death: MI   Brother  Alive   Other  (Not Specified)   No Known Allergies     Medications: Outpatient Medications Prior to Visit  Medication Sig   Ascorbic Acid (VITAMIN C) 500 MG CAPS Take 1 capsule by mouth daily.   aspirin 81 MG tablet Take 1 tablet by mouth daily.   Cholecalciferol (VITAMIN D PO) Take by mouth.   lovastatin (MEVACOR) 20 MG tablet TAKE 1 TABLET BY MOUTH EVERY DAY IN THE EVENING   Multiple Vitamins-Minerals (MULTIVITAMIN ADULT PO) Take 1 tablet by mouth daily.   zolpidem (AMBIEN) 5 MG tablet TAKE 1 TABLET BY MOUTH EVERY DAY AT BEDTIME AS NEEDED FOR SLEEP   No facility-administered medications prior to visit.    Review of Systems  Constitutional:  Negative for activity change and fatigue.  Respiratory:  Negative for cough and shortness of breath.   Cardiovascular:  Negative for chest pain, palpitations and leg swelling.  Neurological:  Negative for dizziness, light-headedness and headaches.   Last metabolic  panel Lab Results  Component Value Date   GLUCOSE 95 01/13/2020   NA 139 01/13/2020   K 3.7 01/13/2020   CL 103 01/13/2020   CO2 23 01/13/2020   BUN 10 01/13/2020   CREATININE 0.94 01/13/2020   GFRNONAA 89 01/13/2020   GFRAA 103 01/13/2020   CALCIUM 9.7 01/13/2020   PROT 6.9 01/13/2020   ALBUMIN 4.9 01/13/2020   LABGLOB 2.0 01/13/2020   AGRATIO 2.5 (H) 01/13/2020   BILITOT 0.7 01/13/2020   ALKPHOS 83 01/13/2020   AST 24 01/13/2020   ALT 22 01/13/2020   Last lipids Lab Results  Component Value Date   CHOL 183 01/13/2020   HDL 69 01/13/2020   LDLCALC 98 01/13/2020   TRIG 86 01/13/2020   CHOLHDL 2.7 01/13/2020       Objective    BP 107/62   Pulse 67   Temp 98.3 F (36.8 C)   Resp 16   Ht 5' 11.5"  (1.816 m)   Wt 152 lb (68.9 kg)   BMI 20.90 kg/m     Physical Exam Constitutional:      General: He is not in acute distress.    Appearance: He is well-developed.  HENT:     Head: Normocephalic and atraumatic.     Right Ear: Hearing normal.     Left Ear: Hearing normal.     Nose: Nose normal.  Eyes:     General: Lids are normal. No scleral icterus.       Right eye: No discharge.        Left eye: No discharge.     Conjunctiva/sclera: Conjunctivae normal.  Cardiovascular:     Rate and Rhythm: Normal rate and regular rhythm.     Heart sounds: Normal heart sounds.  Pulmonary:     Effort: Pulmonary effort is normal. No respiratory distress.     Breath sounds: Normal breath sounds.  Abdominal:     General: Bowel sounds are normal.  Musculoskeletal:        General: Normal range of motion.     Cervical back: Neck supple.  Skin:    Findings: No lesion or rash.  Neurological:     Mental Status: He is alert and oriented to person, place, and time.  Psychiatric:        Speech: Speech normal.        Behavior: Behavior normal.        Thought Content: Thought content normal.      No results found for any visits on 01/22/21.  Assessment & Plan     1. Dyslipidemia Last lipid panel in 2021 showed all levels well controlled. Supposed to be on Lovastatin 20 mg qd, but needs refill today. Recheck labs that were ordered today. Follow low fat diet and exercise as tolerated. - lovastatin (MEVACOR) 20 MG tablet; Take 1 tablet (20 mg total) by mouth every evening.  Dispense: 90 tablet; Refill: 1  2. Hyperglycemia Has had episodes of glucose elevations but no polyuria, polydipsia or polyphagia. Recheck Hgb A1C, CMP and Lipid Panel today.  3. Insomnia Chronic insomnia with long term use of Ambien (at least the past 5 years). No daytime drowsiness, confusion, drop in focus or hangover symptoms. Much better sleep pattern and getting 5-7 hours sleep when he uses the Ambien.  - zolpidem  (AMBIEN) 5 MG tablet; TAKE 1 TABLET BY MOUTH EVERY DAY AT BEDTIME AS NEEDED FOR SLEEP  Dispense: 30 tablet; Refill: 3   No follow-ups on file.  I, Meriel Kelliher, PA-C, have reviewed all documentation for this visit. The documentation on 01/22/21 for the exam, diagnosis, procedures, and orders are all accurate and complete.    Dortha Kern, PA-C  [facility] [phone] (phone) [phone] (fax)  [facility]

## 2021-01-22 NOTE — Telephone Encounter (Signed)
Labs ordered. Maurine Minister advised.

## 2021-01-23 LAB — COMPREHENSIVE METABOLIC PANEL
ALT: 21 IU/L (ref 0–44)
AST: 25 IU/L (ref 0–40)
Albumin/Globulin Ratio: 2.3 — ABNORMAL HIGH (ref 1.2–2.2)
Albumin: 5 g/dL — ABNORMAL HIGH (ref 3.8–4.9)
Alkaline Phosphatase: 83 IU/L (ref 44–121)
BUN/Creatinine Ratio: 11 (ref 9–20)
BUN: 10 mg/dL (ref 6–24)
Bilirubin Total: 0.6 mg/dL (ref 0.0–1.2)
CO2: 23 mmol/L (ref 20–29)
Calcium: 9.9 mg/dL (ref 8.7–10.2)
Chloride: 98 mmol/L (ref 96–106)
Creatinine, Ser: 0.91 mg/dL (ref 0.76–1.27)
Globulin, Total: 2.2 g/dL (ref 1.5–4.5)
Glucose: 105 mg/dL — ABNORMAL HIGH (ref 65–99)
Potassium: 4 mmol/L (ref 3.5–5.2)
Sodium: 136 mmol/L (ref 134–144)
Total Protein: 7.2 g/dL (ref 6.0–8.5)
eGFR: 97 mL/min/{1.73_m2} (ref >59)

## 2021-01-23 LAB — HEMOGLOBIN A1C
Est. average glucose Bld gHb Est-mCnc: 114 mg/dL
Hgb A1c MFr Bld: 5.6 % (ref 4.8–5.6)

## 2021-01-23 LAB — LIPID PANEL
Chol/HDL Ratio: 2.8 ratio (ref 0.0–5.0)
Cholesterol, Total: 215 mg/dL — ABNORMAL HIGH (ref 100–199)
HDL: 77 mg/dL (ref >39)
LDL Chol Calc (NIH): 121 mg/dL — ABNORMAL HIGH (ref 0–99)
Triglycerides: 98 mg/dL (ref 0–149)
VLDL Cholesterol Cal: 17 mg/dL (ref 5–40)

## 2021-02-16 ENCOUNTER — Telehealth: Payer: Self-pay

## 2021-02-16 NOTE — Telephone Encounter (Signed)
Patient was advised. Patient states he only takes lovastatin 20 mg, maybe 4-5 days a week. Patient stated he will start taking it daily and follow up as advised.

## 2021-02-16 NOTE — Telephone Encounter (Signed)
Copied from CRM [id number]. Topic: General - Other >> Feb 15, 2021  3:36 PM Traci Sermon wrote: Reason for CRM: Pt called in about a letter he received about calling the office due to changes in his labs, pt requested a call back. Please advise. *Needs to be called after 3:00pm because pt is at work.

## 2021-12-27 ENCOUNTER — Other Ambulatory Visit: Payer: Self-pay | Admitting: Family Medicine

## 2021-12-27 DIAGNOSIS — E785 Hyperlipidemia, unspecified: Secondary | ICD-10-CM

## 2021-12-27 NOTE — Telephone Encounter (Signed)
Medication Refill - Medication: lovastatin (MEVACOR) 20 MG tablet   Has the patient contacted their pharmacy? Yes.   (Agent: If no, request that the patient contact the pharmacy for the refill. If patient does not wish to contact the pharmacy document the reason why and proceed with request.) (Agent: If yes, when and what did the pharmacy advise?)call pcp  Preferred Pharmacy (with phone number or street name): [id number] - [place], [place] - [address]  [address] [place] [address]  Phone:  [phone]  Fax:  [phone]  Has the patient been seen for an appointment in the last year OR does the patient have an upcoming appointment? Yes.    Agent: Please be advised that RX refills may take up to 3 business days. We ask that you follow-up with your pharmacy.

## 2021-12-28 MED ORDER — LOVASTATIN 20 MG PO TABS: 20.0000 mg | ORAL_TABLET | Freq: Every evening | ORAL | 0 refills | Status: DC

## 2021-12-28 NOTE — Telephone Encounter (Signed)
Requested Prescriptions  Pending Prescriptions Disp Refills  . lovastatin (MEVACOR) 20 MG tablet 90 tablet 0    Sig: Take 1 tablet (20 mg total) by mouth every evening.     Cardiovascular:  Antilipid - Statins 2 Failed - 12/27/2021  2:38 PM      Failed - Lipid Panel in normal range within the last 12 months    Cholesterol, Total  Date Value Ref Range Status  01/22/2021 215 (H) 100 - 199 mg/dL Final   LDL Cholesterol (Calc)  Date Value Ref Range Status  04/13/2017 104 (H) mg/dL (calc) Final    Comment:    Reference range: <100 . Desirable range <100 mg/dL for primary prevention;   <70 mg/dL for patients with CHD or diabetic patients  with > or = 2 CHD risk factors. Marland Kitchen LDL-C is now calculated using the Martin-Hopkins  calculation, which is a validated novel method providing  better accuracy than the Friedewald equation in the  estimation of LDL-C.  Cresenciano Genre et al. Annamaria Helling. [id number]): 2061-2068  (http://education.[employer].com/faq/FAQ164)    LDL Chol Calc (NIH)  Date Value Ref Range Status  01/22/2021 121 (H) 0 - 99 mg/dL Final   HDL  Date Value Ref Range Status  01/22/2021 77 >39 mg/dL Final   Triglycerides  Date Value Ref Range Status  01/22/2021 98 0 - 149 mg/dL Final         Passed - Cr in normal range and within 360 days    Creat  Date Value Ref Range Status  04/13/2017 0.87 0.70 - 1.33 mg/dL Final    Comment:    For patients >20 years of age, the reference limit for Creatinine is approximately 13% higher for people identified as African-American. .    Creatinine, Ser  Date Value Ref Range Status  01/22/2021 0.91 0.76 - 1.27 mg/dL Final         Passed - Patient is not pregnant      Passed - Valid encounter within last 12 months    Recent Outpatient Visits          11 months ago Dyslipidemia   [facility], Vickki Muff, PA-C   1 year ago Dyslipidemia   [facility] Birdie Sons, MD   3 years ago  Dyslipidemia   [facility] Birdie Sons, MD   4 years ago Dyslipidemia   [facility] Birdie Sons, MD   6 years ago Annual physical exam   [facility] Birdie Sons, MD      Future Appointments            In 4 weeks Fisher, Kirstie Peri, MD [facility] [place]

## 2022-01-26 ENCOUNTER — Ambulatory Visit: Payer: BLUE CROSS/BLUE SHIELD | Admitting: Family Medicine

## 2022-04-19 NOTE — Progress Notes (Unsigned)
I,Sulibeya S Dimas,acting as a scribe for Lelon Huh, MD.,have documented all relevant documentation on the behalf of Lelon Huh, MD,as directed by  Lelon Huh, MD while in the presence of Lelon Huh, MD.     Established patient visit   Patient: Bryan Beard   DOB: Nov 16, 1960   61 y.o. Male  MRN: [id number] Visit Date: 04/20/2022  Today's healthcare provider: Lelon Huh, MD   No chief complaint on file.  Subjective    HPI  Lipid/Cholesterol, Follow-up  Last lipid panel Other pertinent labs  Lab Results  Component Value Date   CHOL 215 (H) 01/22/2021   HDL 77 01/22/2021   LDLCALC 121 (H) 01/22/2021   TRIG 98 01/22/2021   CHOLHDL 2.8 01/22/2021   Lab Results  Component Value Date   ALT 21 01/22/2021   AST 25 01/22/2021     He was last seen for this 1 years ago.  Management since that visit includes no changes. Continue to take lovastatin 20mg  daily.  He reports {excellent/good/fair/poor:19665} compliance with treatment. He {is/is not:9024} having side effects. {document side effects if present:1}  Symptoms: {Yes/No:20286} chest pain {Yes/No:20286} chest pressure/discomfort  {Yes/No:20286} dyspnea {Yes/No:20286} lower extremity edema  {Yes/No:20286} numbness or tingling of extremity {Yes/No:20286} orthopnea  {Yes/No:20286} palpitations {Yes/No:20286} paroxysmal nocturnal dyspnea  {Yes/No:20286} speech difficulty {Yes/No:20286} syncope   Current diet: {diet habits:16563} Current exercise: {exercise types:16438}  The ASCVD Risk score (Arnett DK, et al., 2019) failed to calculate for the following reasons:   The systolic blood pressure is missing  --------------------------------------------------------------------------------------------------- Prediabetes, Follow-up  Lab Results  Component Value Date   HGBA1C 5.6 01/22/2021   HGBA1C 5.5 05/18/2018   HGBA1C 5.4 04/13/2017   GLUCOSE 105 (H) 01/22/2021   GLUCOSE 95 01/13/2020   GLUCOSE 89  05/18/2018    Last seen for for this1 years ago.  Management since that visit includes no changes. Current symptoms include none and have been stable.   Pertinent Labs:    Component Value Date/Time   CHOL 215 (H) 01/22/2021 1427   TRIG 98 01/22/2021 1427   CHOLHDL 2.8 01/22/2021 1427   CHOLHDL 3.0 04/13/2017 0914   CREATININE 0.91 01/22/2021 1427   CREATININE 0.87 04/13/2017 0914    Wt Readings from Last 3 Encounters:  01/22/21 152 lb (68.9 kg)  01/13/20 159 lb 12.8 oz (72.5 kg)  05/18/18 152 lb 3.2 oz (69 kg)    -----------------------------------------------------------------------------------------  Medications: Outpatient Medications Prior to Visit  Medication Sig   Ascorbic Acid (VITAMIN C) 500 MG CAPS Take 1 capsule by mouth daily.   aspirin 81 MG tablet Take 1 tablet by mouth daily.   Cholecalciferol (VITAMIN D PO) Take by mouth.   lovastatin (MEVACOR) 20 MG tablet Take 1 tablet (20 mg total) by mouth every evening.   Multiple Vitamins-Minerals (MULTIVITAMIN ADULT PO) Take 1 tablet by mouth daily.   zolpidem (AMBIEN) 5 MG tablet TAKE 1 TABLET BY MOUTH EVERY DAY AT BEDTIME AS NEEDED FOR SLEEP   No facility-administered medications prior to visit.    Review of Systems      Objective    There were no vitals taken for this visit. BP Readings from Last 3 Encounters:  01/22/21 107/62  01/13/20 114/75  05/18/18 118/74   Wt Readings from Last 3 Encounters:  01/22/21 152 lb (68.9 kg)  01/13/20 159 lb 12.8 oz (72.5 kg)  05/18/18 152 lb 3.2 oz (69 kg)      Physical Exam  ***  No results found for any  visits on 04/20/22.  Assessment & Plan     ***  No follow-ups on file.      {provider attestation***:1}   Lelon Huh, MD  [facility] [phone] (phone) [phone] (fax)  [place]

## 2022-04-20 ENCOUNTER — Ambulatory Visit (INDEPENDENT_AMBULATORY_CARE_PROVIDER_SITE_OTHER): Payer: 59 | Admitting: Family Medicine

## 2022-04-20 ENCOUNTER — Encounter: Payer: Self-pay | Admitting: Family Medicine

## 2022-04-20 VITALS — BP 105/66 | HR 62 | Temp 98.7°F | Resp 16 | Wt 159.3 lb

## 2022-04-20 DIAGNOSIS — Z23 Encounter for immunization: Secondary | ICD-10-CM

## 2022-04-20 DIAGNOSIS — F5101 Primary insomnia: Secondary | ICD-10-CM | POA: Diagnosis not present

## 2022-04-20 DIAGNOSIS — R739 Hyperglycemia, unspecified: Secondary | ICD-10-CM | POA: Diagnosis not present

## 2022-04-20 DIAGNOSIS — E785 Hyperlipidemia, unspecified: Secondary | ICD-10-CM | POA: Diagnosis not present

## 2022-04-20 DIAGNOSIS — Z125 Encounter for screening for malignant neoplasm of prostate: Secondary | ICD-10-CM

## 2022-04-20 DIAGNOSIS — Z1211 Encounter for screening for malignant neoplasm of colon: Secondary | ICD-10-CM

## 2022-04-20 MED ORDER — ZOLPIDEM TARTRATE 5 MG PO TABS: ORAL_TABLET | ORAL | 3 refills | Status: DC

## 2022-04-21 ENCOUNTER — Other Ambulatory Visit: Payer: Self-pay | Admitting: Family Medicine

## 2022-04-21 LAB — HEMOGLOBIN A1C
Est. average glucose Bld gHb Est-mCnc: 117 mg/dL
Hgb A1c MFr Bld: 5.7 % — ABNORMAL HIGH (ref 4.8–5.6)

## 2022-04-21 LAB — COMPREHENSIVE METABOLIC PANEL
ALT: 22 IU/L (ref 0–44)
AST: 33 IU/L (ref 0–40)
Albumin/Globulin Ratio: 2.4 — ABNORMAL HIGH (ref 1.2–2.2)
Albumin: 4.8 g/dL (ref 3.9–4.9)
Alkaline Phosphatase: 91 IU/L (ref 44–121)
BUN/Creatinine Ratio: 9 — ABNORMAL LOW (ref 10–24)
BUN: 8 mg/dL (ref 8–27)
Bilirubin Total: 0.6 mg/dL (ref 0.0–1.2)
CO2: 24 mmol/L (ref 20–29)
Calcium: 9.9 mg/dL (ref 8.6–10.2)
Chloride: 100 mmol/L (ref 96–106)
Creatinine, Ser: 0.9 mg/dL (ref 0.76–1.27)
Globulin, Total: 2 g/dL (ref 1.5–4.5)
Glucose: 105 mg/dL — ABNORMAL HIGH (ref 70–99)
Potassium: 4.9 mmol/L (ref 3.5–5.2)
Sodium: 140 mmol/L (ref 134–144)
Total Protein: 6.8 g/dL (ref 6.0–8.5)
eGFR: 97 mL/min/{1.73_m2} (ref >59)

## 2022-04-21 LAB — PSA TOTAL (REFLEX TO FREE): Prostate Specific Ag, Serum: 0.4 ng/mL (ref 0.0–4.0)

## 2022-04-21 LAB — LIPID PANEL WITH LDL/HDL RATIO
Cholesterol, Total: 203 mg/dL — ABNORMAL HIGH (ref 100–199)
HDL: 59 mg/dL (ref >39)
LDL Chol Calc (NIH): 121 mg/dL — ABNORMAL HIGH (ref 0–99)
LDL/HDL Ratio: 2.1 ratio (ref 0.0–3.6)
Triglycerides: 130 mg/dL (ref 0–149)
VLDL Cholesterol Cal: 23 mg/dL (ref 5–40)

## 2022-07-26 LAB — COLOGUARD

## 2022-10-24 ENCOUNTER — Ambulatory Visit: Payer: 59 | Admitting: Family Medicine

## 2023-05-01 ENCOUNTER — Encounter: Payer: Self-pay | Admitting: Family Medicine

## 2023-05-01 ENCOUNTER — Ambulatory Visit (INDEPENDENT_AMBULATORY_CARE_PROVIDER_SITE_OTHER): Payer: Managed Care, Other (non HMO) | Admitting: Family Medicine

## 2023-05-01 VITALS — BP 124/78 | HR 84 | Temp 97.9°F | Ht 71.5 in | Wt 156.1 lb

## 2023-05-01 DIAGNOSIS — Z23 Encounter for immunization: Secondary | ICD-10-CM | POA: Diagnosis not present

## 2023-05-01 DIAGNOSIS — R739 Hyperglycemia, unspecified: Secondary | ICD-10-CM | POA: Diagnosis not present

## 2023-05-01 DIAGNOSIS — Z8249 Family history of ischemic heart disease and other diseases of the circulatory system: Secondary | ICD-10-CM

## 2023-05-01 DIAGNOSIS — Z1211 Encounter for screening for malignant neoplasm of colon: Secondary | ICD-10-CM

## 2023-05-01 DIAGNOSIS — Z0001 Encounter for general adult medical examination with abnormal findings: Secondary | ICD-10-CM

## 2023-05-01 DIAGNOSIS — E785 Hyperlipidemia, unspecified: Secondary | ICD-10-CM

## 2023-05-01 DIAGNOSIS — K429 Umbilical hernia without obstruction or gangrene: Secondary | ICD-10-CM | POA: Diagnosis not present

## 2023-05-01 DIAGNOSIS — Z0000 Encounter for general adult medical examination without abnormal findings: Secondary | ICD-10-CM

## 2023-05-01 DIAGNOSIS — Z125 Encounter for screening for malignant neoplasm of prostate: Secondary | ICD-10-CM

## 2023-05-01 DIAGNOSIS — F5101 Primary insomnia: Secondary | ICD-10-CM

## 2023-05-01 MED ORDER — ZOLPIDEM TARTRATE 5 MG PO TABS: ORAL_TABLET | ORAL | 3 refills | Status: AC

## 2023-05-01 NOTE — Progress Notes (Signed)
Complete physical exam   Patient: Bryan Beard   DOB: Apr 02, 1961   62 y.o. Male  MRN: [id number] Visit Date: 05/01/2023  Today's healthcare provider: Mila Merry, MD   Chief Complaint  Patient presents with   Annual Exam    Annual visit. Labs.   Subjective    Discussed the use of AI scribe software for clinical note transcription with the patient, who gave verbal consent to proceed.  History of Present Illness   The patient, with a history of hyperlipidemia, presented for a routine annual physical examination. He reported adherence to a regimen of Zolpidem, taken approximately twice a week, which continues to be effective. He also reported taking over-the-counter vitamins, including daily vitamin C, a multivitamin three times a week, and aspirin approximately six days a week. Vitamin D supplementation was reported as irregular. His has history of hyperlipidemia but discontinue last year since cholesterol had greatly improved with dietary improvements.   He reported no new health problems, specifically denying any chest pain, heart flutter, or shortness of breath.  The patient also reported a failed attempt at a Cologuard test due to an over-collection of the sample. He agreed to attempt the collection again.  The patient also reported a long-standing umbilical hernia, present for approximately ten years, which has not caused any discomfort or issues. He reported being careful when lifting heavy objects due to this hernia.  The patient also mentioned a gradual onset of blurry vision over the past four to five years, particularly when using a computer. He acknowledged the need for an eye examination but had been delaying it.        History reviewed. No pertinent past medical history. Past Surgical History:  Procedure Laterality Date   HERNIA REPAIR  1993   Right inguinal   Social History   Socioeconomic History   Marital status: Single    Spouse name: Not on file    Number of children: 0   Years of education: Not on file   Highest education level: Not on file  Occupational History    Comment: Works in [employer]  Tobacco Use   Smoking status: Never   Smokeless tobacco: Never  Substance and Sexual Activity   Alcohol use: Yes    Alcohol/week: 0.0 standard drinks of alcohol    Comment: 3-4 beers about 3 days a week   Drug use: No    Comment: has used Marijuana in the past 30 years   Sexual activity: Not on file  Other Topics Concern   Not on file  Social History Narrative   Not on file   Social Determinants of Health   Financial Resource Strain: Not on file  Food Insecurity: Not on file  Transportation Needs: Not on file  Physical Activity: Not on file  Stress: Not on file  Social Connections: Not on file  Intimate Partner Violence: Not on file   Family Status  Relation Name Status   Mother  Deceased       died at age 40.    Father  Deceased at age 50       cause of death: MI   Brother  Alive   Other  (Not Specified)  No partnership data on file   Family History  Problem Relation Age of Onset   Heart attack Father    Diabetes Father        TYPE 2   CAD Other    No Known Allergies  Patient  Care Team: Malva Limes, MD as PCP - General (Family Medicine)   Medications: Outpatient Medications Prior to Visit  Medication Sig   Ascorbic Acid (VITAMIN C) 500 MG CAPS Take 1 capsule by mouth daily.   aspirin 81 MG tablet Take 1 tablet by mouth daily.   Cholecalciferol (VITAMIN D PO) Take by mouth.   Multiple Vitamins-Minerals (MULTIVITAMIN ADULT PO) Take 1 tablet by mouth daily.   [DISCONTINUED] zolpidem (AMBIEN) 5 MG tablet TAKE 1 TABLET BY MOUTH EVERY DAY AT BEDTIME AS NEEDED FOR SLEEP   No facility-administered medications prior to visit.    Review of Systems  Constitutional:  Negative for chills, diaphoresis and fever.  HENT:  Negative for congestion, ear discharge, ear pain, hearing loss, nosebleeds, sore throat  and tinnitus.   Eyes:  Negative for photophobia, pain, discharge and redness.  Respiratory:  Negative for cough, shortness of breath, wheezing and stridor.   Cardiovascular:  Negative for chest pain, palpitations and leg swelling.  Gastrointestinal:  Negative for abdominal pain, blood in stool, constipation, diarrhea, nausea and vomiting.  Endocrine: Negative for polydipsia.  Genitourinary:  Negative for dysuria, flank pain, frequency, hematuria and urgency.  Musculoskeletal:  Negative for back pain, myalgias and neck pain.  Skin:  Negative for rash.  Allergic/Immunologic: Negative for environmental allergies.  Neurological:  Negative for dizziness, tremors, seizures, weakness and headaches.  Hematological:  Does not bruise/bleed easily.  Psychiatric/Behavioral:  Negative for hallucinations and suicidal ideas. The patient is not nervous/anxious.       Objective    BP 124/78   Pulse 84   Temp 97.9 F (36.6 C) (Oral)   Ht 5' 11.5" (1.816 m)   Wt 156 lb 1.6 oz (70.8 kg)   SpO2 100%   BMI 21.47 kg/m    Physical Exam  General Appearance:    Well developed, well nourished male. Alert, cooperative, in no acute distress, appears stated age  Head:    Normocephalic, without obvious abnormality, atraumatic  Eyes:    PERRL, conjunctiva/corneas clear, EOM's intact, fundi    benign, both eyes       Ears:    Normal TM's and external ear canals, both ears  Nose:   Nares normal, septum midline, mucosa normal, no drainage   or sinus tenderness  Throat:   Lips, mucosa, and tongue normal; teeth and gums normal  Neck:   Supple, symmetrical, trachea midline, no adenopathy;       thyroid:  No enlargement/tenderness/nodules; no carotid   bruit or JVD  Back:     Symmetric, no curvature, ROM normal, no CVA tenderness  Lungs:     Clear to auscultation bilaterally, respirations unlabored  Chest wall:    No tenderness or deformity  Heart:    Normal heart rate. Normal rhythm. No murmurs, rubs, or  gallops.  S1 and S2 normal  Abdomen:     Soft, non-tender, bowel sounds active all four quadrants,    no masses, no organomegaly. Small umbilical hernia, non-tender  Genitalia:    deferred  Rectal:    deferred  Extremities:   All extremities are intact. No cyanosis or edema  Pulses:   2+ and symmetric all extremities  Skin:   Skin color, texture, turgor normal, no rashes or lesions  Lymph nodes:   Cervical, supraclavicular, and axillary nodes normal  Neurologic:   CNII-XII intact. Normal strength, sensation and reflexes      throughout       Last depression screening scores  05/01/2023    8:35 AM 04/20/2022    8:17 AM 01/13/2020    2:41 PM  PHQ 2/9 Scores  PHQ - 2 Score 1 0 0  PHQ- 9 Score 2 2    Last fall risk screening    05/01/2023    8:35 AM  Fall Risk   Falls in the past year? 0  Number falls in past yr: 0  Injury with Fall? 0  Risk for fall due to : No Fall Risks  Follow up Falls evaluation completed   Last Audit-C alcohol use screening    04/20/2022    8:18 AM  Alcohol Use Disorder Test (AUDIT)  1. How often do you have a drink containing alcohol? 1  2. How many drinks containing alcohol do you have on a typical day when you are drinking? 0  3. How often do you have six or more drinks on one occasion? 0  AUDIT-C Score 1   A score of 3 or more in women, and 4 or more in men indicates increased risk for alcohol abuse, EXCEPT if all of the points are from question 1   No results found for any visits on 05/01/23.  Assessment & Plan    Routine Health Maintenance and Physical Exam  Exercise Activities and Dietary recommendations  Goals   None     Immunization History  Administered Date(s) Administered   Influenza, Seasonal, Injecte, Preservative Fre 05/01/2023   Influenza,inj,Quad PF,6+ Mos 05/18/2018, 06/07/2021, 04/20/2022   Influenza-Unspecified 05/19/2015   PFIZER Comirnaty(Gray Top)Covid-19 Tri-Sucrose Vaccine 03/09/2021   PFIZER(Purple  Top)SARS-COV-2 Vaccination 12/11/2019, 01/01/2020   Tdap 11/25/2009, 04/20/2022   Zoster Recombinant(Shingrix) 09/08/2020, 11/17/2020    Health Maintenance  Topic Date Due   HIV Screening  Never done   Fecal DNA (Cologuard)  Never done   COVID-19 Vaccine (4 - 2023-24 season) 03/19/2023   DTaP/Tdap/Td (3 - Td or Tdap) 04/20/2032   INFLUENZA VACCINE  Completed   Zoster Vaccines- Shingrix  Completed   Hepatitis C Screening  Addressed   HPV VACCINES  Aged Out    Discussed health benefits of physical activity, and encouraged him to engage in regular exercise appropriate for his age and condition.     -Order metabolic panel, including glucose, to assess for diabetes and kidney function. -Order PSA for prostate cancer screening. -Resend Cologuard kit for colorectal cancer screening. Instruct patient on proper sample collection. -Administer influenza vaccine today. -Encourage patient to schedule eye exam due to reported blurring vision and risk of glaucoma and macular degeneration. Hyperlipidemia Off statin therapy for the past year. No new symptoms suggestive of cardiovascular disease. -Order lipid panel to assess current cholesterol levels.  Insomnia Intermittent use of Zolpidem, approximately twice weekly. Reports continued efficacy. -Continue current regimen.  Umbilical Hernia Chronic, asymptomatic hernia noted on physical exam. Patient reports careful lifting practices. -No intervention needed at this time. Continue to monitor for changes or symptoms.     Check A1c due to history of elevated glucose.        Mila Merry, MD  [facility] [phone] (phone) [phone] (fax)  [facility]

## 2023-05-03 LAB — PSA: Prostate Specific Ag, Serum: 0.5 ng/mL (ref 0.0–4.0)

## 2023-05-03 LAB — CBC
Hematocrit: 45.8 % (ref 37.5–51.0)
Hemoglobin: 15.2 g/dL (ref 13.0–17.7)
MCH: 30.9 pg (ref 26.6–33.0)
MCHC: 33.2 g/dL (ref 31.5–35.7)
MCV: 93 fL (ref 79–97)
Platelets: 339 10*3/uL (ref 150–450)
RBC: 4.92 x10E6/uL (ref 4.14–5.80)
RDW: 12.7 % (ref 11.6–15.4)
WBC: 5.2 10*3/uL (ref 3.4–10.8)

## 2023-05-03 LAB — COMPREHENSIVE METABOLIC PANEL
ALT: 19 [IU]/L (ref 0–44)
AST: 24 [IU]/L (ref 0–40)
Albumin: 4.9 g/dL (ref 3.9–4.9)
Alkaline Phosphatase: 82 [IU]/L (ref 44–121)
BUN/Creatinine Ratio: 9 — ABNORMAL LOW (ref 10–24)
BUN: 10 mg/dL (ref 8–27)
Bilirubin Total: 0.6 mg/dL (ref 0.0–1.2)
CO2: 24 mmol/L (ref 20–29)
Calcium: 10.3 mg/dL — ABNORMAL HIGH (ref 8.6–10.2)
Chloride: 100 mmol/L (ref 96–106)
Creatinine, Ser: 1.09 mg/dL (ref 0.76–1.27)
Globulin, Total: 2.4 g/dL (ref 1.5–4.5)
Glucose: 95 mg/dL (ref 70–99)
Potassium: 4.9 mmol/L (ref 3.5–5.2)
Sodium: 141 mmol/L (ref 134–144)
Total Protein: 7.3 g/dL (ref 6.0–8.5)
eGFR: 77 mL/min/{1.73_m2} (ref >59)

## 2023-05-03 LAB — LIPOPROTEIN A (LPA): Lipoprotein (a): 177.4 nmol/L — ABNORMAL HIGH (ref <75.0)

## 2023-05-03 LAB — LIPID PANEL
Chol/HDL Ratio: 3.9 {ratio} (ref 0.0–5.0)
Cholesterol, Total: 222 mg/dL — ABNORMAL HIGH (ref 100–199)
HDL: 57 mg/dL (ref >39)
LDL Chol Calc (NIH): 120 mg/dL — ABNORMAL HIGH (ref 0–99)
Triglycerides: 260 mg/dL — ABNORMAL HIGH (ref 0–149)
VLDL Cholesterol Cal: 45 mg/dL — ABNORMAL HIGH (ref 5–40)

## 2023-05-03 LAB — HEMOGLOBIN A1C
Est. average glucose Bld gHb Est-mCnc: 120 mg/dL
Hgb A1c MFr Bld: 5.8 % — ABNORMAL HIGH (ref 4.8–5.6)

## 2023-05-03 LAB — APOLIPOPROTEIN B: Apolipoprotein B: 109 mg/dL — ABNORMAL HIGH (ref <90)

## 2023-05-04 ENCOUNTER — Other Ambulatory Visit: Payer: Self-pay | Admitting: Family Medicine

## 2023-05-04 DIAGNOSIS — Z8249 Family history of ischemic heart disease and other diseases of the circulatory system: Secondary | ICD-10-CM

## 2023-05-04 DIAGNOSIS — E785 Hyperlipidemia, unspecified: Secondary | ICD-10-CM

## 2023-05-04 DIAGNOSIS — E7841 Elevated Lipoprotein(a): Secondary | ICD-10-CM | POA: Insufficient documentation

## 2023-05-04 MED ORDER — ROSUVASTATIN CALCIUM 10 MG PO TABS
10.0000 mg | ORAL_TABLET | Freq: Every day | ORAL | 1 refills | Status: DC
Start: 2023-05-04 — End: 2023-09-27

## 2023-06-16 LAB — COLOGUARD: COLOGUARD: POSITIVE — AB

## 2023-06-21 ENCOUNTER — Other Ambulatory Visit: Payer: Self-pay | Admitting: Family Medicine

## 2023-06-21 DIAGNOSIS — R195 Other fecal abnormalities: Secondary | ICD-10-CM

## 2023-06-22 ENCOUNTER — Telehealth: Payer: Self-pay

## 2023-06-22 ENCOUNTER — Other Ambulatory Visit: Payer: Self-pay

## 2023-06-22 DIAGNOSIS — Z1211 Encounter for screening for malignant neoplasm of colon: Secondary | ICD-10-CM

## 2023-06-22 DIAGNOSIS — R195 Other fecal abnormalities: Secondary | ICD-10-CM

## 2023-06-22 MED ORDER — NA SULFATE-K SULFATE-MG SULF 17.5-3.13-1.6 GM/177ML PO SOLN: 1.0000 | Freq: Once | ORAL | 0 refills | Status: AC

## 2023-06-22 NOTE — Telephone Encounter (Signed)
Gastroenterology Pre-Procedure Review  Request Date: 07/10/23 Requesting Physician: Dr. Tobi Bastos  PATIENT REVIEW QUESTIONS: The patient responded to the following health history questions as indicated:    1. Are you having any GI issues? No GI Issues.  Positive Cologuard. First colonoscopy. 2. Do you have a personal history of Polyps? no 3. Do you have a family history of Colon Cancer or Polyps? no 4. Diabetes Mellitus? no 5. Joint replacements in the past 12 months?no 6. Major health problems in the past 3 months?no 7. Any artificial heart valves, MVP, or defibrillator?no    MEDICATIONS & ALLERGIES:    Patient reports the following regarding taking any anticoagulation/antiplatelet therapy:   Plavix, Coumadin, Eliquis, Xarelto, Lovenox, Pradaxa, Brilinta, or Effient? no Aspirin? 81 mg daily  Patient confirms/reports the following medications:  Current Outpatient Medications  Medication Sig Dispense Refill   Ascorbic Acid (VITAMIN C) 500 MG CAPS Take 1 capsule by mouth daily.     aspirin 81 MG tablet Take 1 tablet by mouth daily.     Cholecalciferol (VITAMIN D PO) Take by mouth.     Multiple Vitamins-Minerals (MULTIVITAMIN ADULT PO) Take 1 tablet by mouth daily.     rosuvastatin (CRESTOR) 10 MG tablet Take 1 tablet (10 mg total) by mouth daily. 90 tablet 1   zolpidem (AMBIEN) 5 MG tablet TAKE 1 TABLET BY MOUTH EVERY DAY AT BEDTIME AS NEEDED FOR SLEEP 30 tablet 3   No current facility-administered medications for this visit.    Patient confirms/reports the following allergies:  No Known Allergies  No orders of the defined types were placed in this encounter.   AUTHORIZATION INFORMATION Primary Insurance: 1D#: Group #:  Secondary Insurance: 1D#: Group #:  SCHEDULE INFORMATION: Date: 07/10/23 Time: Location: [facility]

## 2023-07-03 ENCOUNTER — Encounter: Payer: Self-pay | Admitting: Gastroenterology

## 2023-07-03 ENCOUNTER — Telehealth: Payer: Self-pay

## 2023-07-03 NOTE — Telephone Encounter (Signed)
Copied from CRM [id number]. Topic: General - Other >> Jul 03, 2023  2:04 PM Franchot Heidelberg wrote: Reason for CRM: Pt called reporting that he was instructed to contact his PCP regarding medication prior to his colonoscopy, please advise.

## 2023-07-04 NOTE — Telephone Encounter (Signed)
Patient returned call- GI had asked him if he received his prep- for some reason they told him to contact PCP. I called the pharmacy and the Rx was in his file dated 13/3/24- they are going to fill it and he can pick it up this afternoon.

## 2023-07-05 ENCOUNTER — Telehealth: Payer: Self-pay

## 2023-07-05 NOTE — Telephone Encounter (Signed)
Patient called in left a voicemail wanting to talk to the scheduler about his procedure. I called the patient back to inform him we receive his message, and I sent the message to the scheduler.

## 2023-07-10 ENCOUNTER — Ambulatory Visit
Admission: RE | Admit: 2023-07-10 | Payer: Managed Care, Other (non HMO) | Source: Home / Self Care | Admitting: Gastroenterology

## 2023-07-10 ENCOUNTER — Encounter: Admission: RE | Payer: Self-pay | Source: Home / Self Care

## 2023-07-10 SURGERY — COLONOSCOPY WITH PROPOFOL
Anesthesia: General

## 2023-09-27 ENCOUNTER — Other Ambulatory Visit: Payer: Self-pay | Admitting: Family Medicine

## 2023-09-27 DIAGNOSIS — E785 Hyperlipidemia, unspecified: Secondary | ICD-10-CM

## 2023-09-27 DIAGNOSIS — Z8249 Family history of ischemic heart disease and other diseases of the circulatory system: Secondary | ICD-10-CM

## 2023-09-27 MED ORDER — ROSUVASTATIN CALCIUM 10 MG PO TABS: 10.0000 mg | ORAL_TABLET | Freq: Every day | ORAL | 1 refills | Status: DC

## 2024-01-26 ENCOUNTER — Ambulatory Visit (INDEPENDENT_AMBULATORY_CARE_PROVIDER_SITE_OTHER): Payer: Self-pay | Admitting: Otolaryngology

## 2024-04-25 ENCOUNTER — Other Ambulatory Visit: Payer: Self-pay | Admitting: Family Medicine

## 2024-04-25 DIAGNOSIS — E785 Hyperlipidemia, unspecified: Secondary | ICD-10-CM

## 2024-04-25 DIAGNOSIS — Z8249 Family history of ischemic heart disease and other diseases of the circulatory system: Secondary | ICD-10-CM

## 2024-05-20 ENCOUNTER — Other Ambulatory Visit: Payer: Self-pay | Admitting: Family Medicine

## 2024-05-20 DIAGNOSIS — Z8249 Family history of ischemic heart disease and other diseases of the circulatory system: Secondary | ICD-10-CM

## 2024-05-20 DIAGNOSIS — E785 Hyperlipidemia, unspecified: Secondary | ICD-10-CM

## 2024-05-24 ENCOUNTER — Other Ambulatory Visit: Payer: Self-pay | Admitting: Family Medicine

## 2024-05-24 DIAGNOSIS — E785 Hyperlipidemia, unspecified: Secondary | ICD-10-CM

## 2024-05-24 DIAGNOSIS — Z8249 Family history of ischemic heart disease and other diseases of the circulatory system: Secondary | ICD-10-CM

## 2024-06-22 ENCOUNTER — Other Ambulatory Visit: Payer: Self-pay | Admitting: Family Medicine

## 2024-06-22 DIAGNOSIS — E785 Hyperlipidemia, unspecified: Secondary | ICD-10-CM

## 2024-06-22 DIAGNOSIS — Z8249 Family history of ischemic heart disease and other diseases of the circulatory system: Secondary | ICD-10-CM

## 2024-07-23 ENCOUNTER — Other Ambulatory Visit: Payer: Self-pay | Admitting: Family Medicine

## 2024-07-23 DIAGNOSIS — E785 Hyperlipidemia, unspecified: Secondary | ICD-10-CM

## 2024-07-23 DIAGNOSIS — Z8249 Family history of ischemic heart disease and other diseases of the circulatory system: Secondary | ICD-10-CM

## 2024-08-01 ENCOUNTER — Other Ambulatory Visit: Payer: Self-pay | Admitting: Family Medicine

## 2024-08-01 DIAGNOSIS — F5101 Primary insomnia: Secondary | ICD-10-CM

## 2024-08-10 ENCOUNTER — Other Ambulatory Visit: Payer: Self-pay | Admitting: Family Medicine

## 2024-08-10 DIAGNOSIS — E785 Hyperlipidemia, unspecified: Secondary | ICD-10-CM

## 2024-08-10 DIAGNOSIS — Z8249 Family history of ischemic heart disease and other diseases of the circulatory system: Secondary | ICD-10-CM
